# Patient Record
Sex: Female | Born: 1950 | Race: White | Hispanic: No | Marital: Married | Smoking: Never smoker
Health system: Southern US, Community
[De-identification: ages and names within clinical notes are randomized; demographics above are authoritative.]

## PROBLEM LIST (undated history)

## (undated) DIAGNOSIS — I10 Essential (primary) hypertension: Secondary | ICD-10-CM

## (undated) HISTORY — PX: REFRACTIVE SURGERY: SHX103

## (undated) HISTORY — DX: Essential (primary) hypertension: I10

---

## 1997-10-17 ENCOUNTER — Other Ambulatory Visit: Admission: RE | Admit: 1997-10-17 | Discharge: 1997-10-17 | Payer: Self-pay | Admitting: Obstetrics and Gynecology

## 2000-07-27 ENCOUNTER — Other Ambulatory Visit: Admission: RE | Admit: 2000-07-27 | Discharge: 2000-07-27 | Payer: Self-pay | Admitting: Obstetrics and Gynecology

## 2001-11-09 ENCOUNTER — Other Ambulatory Visit: Admission: RE | Admit: 2001-11-09 | Discharge: 2001-11-09 | Payer: Self-pay | Admitting: Obstetrics and Gynecology

## 2003-02-17 ENCOUNTER — Other Ambulatory Visit: Admission: RE | Admit: 2003-02-17 | Discharge: 2003-02-17 | Payer: Self-pay | Admitting: Obstetrics and Gynecology

## 2004-06-20 ENCOUNTER — Other Ambulatory Visit: Admission: RE | Admit: 2004-06-20 | Discharge: 2004-06-20 | Payer: Self-pay | Admitting: Obstetrics and Gynecology

## 2004-12-18 ENCOUNTER — Ambulatory Visit: Payer: Self-pay | Admitting: Family Medicine

## 2005-05-23 ENCOUNTER — Ambulatory Visit: Payer: Self-pay | Admitting: Family Medicine

## 2005-09-02 ENCOUNTER — Other Ambulatory Visit: Admission: RE | Admit: 2005-09-02 | Discharge: 2005-09-02 | Payer: Self-pay | Admitting: Obstetrics and Gynecology

## 2006-06-17 ENCOUNTER — Ambulatory Visit: Payer: Self-pay | Admitting: Family Medicine

## 2006-06-17 LAB — CONVERTED CEMR LAB
ALT: 20 units/L (ref 0–40)
AST: 22 units/L (ref 0–37)
Albumin: 4.1 g/dL (ref 3.5–5.2)
Alkaline Phosphatase: 72 units/L (ref 39–117)
BUN: 17 mg/dL (ref 6–23)
Basophils Absolute: 0 10*3/uL (ref 0.0–0.1)
CO2: 31 meq/L (ref 19–32)
Cholesterol: 201 mg/dL (ref 0–200)
Eosinophil percent: 0.8 % (ref 0.0–5.0)
GFR calc non Af Amer: 93 mL/min
Glomerular Filtration Rate, Af Am: 113 mL/min/{1.73_m2}
HCT: 43.3 % (ref 36.0–46.0)
Hgb A1c MFr Bld: 5.7 % (ref 4.6–6.0)
LDL DIRECT: 134.4 mg/dL
MCHC: 34.5 g/dL (ref 30.0–36.0)
Neutro Abs: 4.1 10*3/uL (ref 1.4–7.7)
Neutrophils Relative %: 61.9 % (ref 43.0–77.0)
RDW: 12.5 % (ref 11.5–14.6)
Sodium: 145 meq/L (ref 135–145)
TSH: 2.39 microintl units/mL (ref 0.35–5.50)
Total Protein: 7.2 g/dL (ref 6.0–8.3)
VLDL: 19 mg/dL (ref 0–40)

## 2006-06-23 ENCOUNTER — Ambulatory Visit: Payer: Self-pay | Admitting: Family Medicine

## 2006-07-21 HISTORY — PX: JOINT REPLACEMENT: SHX530

## 2006-10-27 ENCOUNTER — Encounter: Admission: RE | Admit: 2006-10-27 | Discharge: 2006-10-27 | Payer: Self-pay | Admitting: Family Medicine

## 2007-01-25 ENCOUNTER — Ambulatory Visit: Payer: Self-pay | Admitting: Family Medicine

## 2007-01-25 LAB — CONVERTED CEMR LAB
Direct LDL: 136.6 mg/dL
HDL: 44.6 mg/dL (ref >39.0)

## 2007-02-11 DIAGNOSIS — I10 Essential (primary) hypertension: Secondary | ICD-10-CM | POA: Insufficient documentation

## 2007-04-02 ENCOUNTER — Ambulatory Visit: Payer: Self-pay | Admitting: Family Medicine

## 2007-04-13 ENCOUNTER — Ambulatory Visit: Payer: Self-pay | Admitting: Family Medicine

## 2007-05-03 ENCOUNTER — Inpatient Hospital Stay (HOSPITAL_COMMUNITY): Admission: RE | Admit: 2007-05-03 | Discharge: 2007-05-05 | Payer: Self-pay | Admitting: Orthopedic Surgery

## 2007-06-07 ENCOUNTER — Inpatient Hospital Stay (HOSPITAL_COMMUNITY): Admission: RE | Admit: 2007-06-07 | Discharge: 2007-06-09 | Payer: Self-pay | Admitting: Orthopedic Surgery

## 2007-09-08 ENCOUNTER — Ambulatory Visit: Payer: Self-pay | Admitting: Family Medicine

## 2007-09-08 LAB — CONVERTED CEMR LAB
Albumin: 4.1 g/dL (ref 3.5–5.2)
Alkaline Phosphatase: 61 units/L (ref 39–117)
BUN: 12 mg/dL (ref 6–23)
Basophils Absolute: 0 10*3/uL (ref 0.0–0.1)
Bilirubin Urine: NEGATIVE
Blood in Urine, dipstick: NEGATIVE
Cholesterol: 168 mg/dL (ref 0–200)
Eosinophils Absolute: 0 10*3/uL (ref 0.0–0.6)
Eosinophils Relative: 0.3 % (ref 0.0–5.0)
GFR calc non Af Amer: 79 mL/min
Glucose, Bld: 97 mg/dL (ref 70–99)
Glucose, Urine, Semiquant: NEGATIVE
HDL: 56.1 mg/dL (ref >39.0)
Ketones, urine, test strip: NEGATIVE
Lymphocytes Relative: 23 % (ref 12.0–46.0)
Monocytes Relative: 6.3 % (ref 3.0–11.0)
RDW: 13.3 % (ref 11.5–14.6)
Sodium: 140 meq/L (ref 135–145)
Specific Gravity, Urine: 1.02
TSH: 1.34 microintl units/mL (ref 0.35–5.50)
Total CHOL/HDL Ratio: 3
Triglycerides: 78 mg/dL (ref 0–149)
WBC Urine, dipstick: NEGATIVE
pH: 7

## 2007-09-16 ENCOUNTER — Ambulatory Visit: Payer: Self-pay | Admitting: Family Medicine

## 2007-09-16 ENCOUNTER — Telehealth (INDEPENDENT_AMBULATORY_CARE_PROVIDER_SITE_OTHER): Payer: Self-pay | Admitting: *Deleted

## 2007-09-16 DIAGNOSIS — M1710 Unilateral primary osteoarthritis, unspecified knee: Secondary | ICD-10-CM | POA: Insufficient documentation

## 2007-09-16 DIAGNOSIS — E663 Overweight: Secondary | ICD-10-CM | POA: Insufficient documentation

## 2007-09-16 DIAGNOSIS — F330 Major depressive disorder, recurrent, mild: Secondary | ICD-10-CM

## 2007-09-18 ENCOUNTER — Encounter: Admission: RE | Admit: 2007-09-18 | Discharge: 2007-09-18 | Payer: Self-pay | Admitting: Orthopedic Surgery

## 2007-10-28 ENCOUNTER — Encounter: Admission: RE | Admit: 2007-10-28 | Discharge: 2007-10-28 | Payer: Self-pay | Admitting: Obstetrics and Gynecology

## 2007-11-09 ENCOUNTER — Encounter: Admission: RE | Admit: 2007-11-09 | Discharge: 2007-11-09 | Payer: Self-pay | Admitting: Obstetrics and Gynecology

## 2007-11-16 ENCOUNTER — Encounter: Payer: Self-pay | Admitting: Family Medicine

## 2008-04-03 ENCOUNTER — Encounter: Payer: Self-pay | Admitting: *Deleted

## 2008-04-25 ENCOUNTER — Encounter: Admission: RE | Admit: 2008-04-25 | Discharge: 2008-04-25 | Payer: Self-pay | Admitting: Family Medicine

## 2008-10-16 ENCOUNTER — Telehealth: Payer: Self-pay | Admitting: Family Medicine

## 2008-10-18 ENCOUNTER — Ambulatory Visit: Payer: Self-pay | Admitting: Family Medicine

## 2008-10-18 LAB — CONVERTED CEMR LAB
ALT: 20 units/L (ref 0–35)
AST: 20 units/L (ref 0–37)
Albumin: 3.9 g/dL (ref 3.5–5.2)
BUN: 13 mg/dL (ref 6–23)
Basophils Absolute: 0 10*3/uL (ref 0.0–0.1)
Basophils Relative: 0.4 % (ref 0.0–3.0)
Cholesterol: 181 mg/dL (ref 0–200)
Eosinophils Absolute: 0.1 10*3/uL (ref 0.0–0.7)
Eosinophils Relative: 1 % (ref 0.0–5.0)
HCT: 42.6 % (ref 36.0–46.0)
Ketones, urine, test strip: NEGATIVE
Lymphs Abs: 2 10*3/uL (ref 0.7–4.0)
MCHC: 34.9 g/dL (ref 30.0–36.0)
MCV: 87 fL (ref 78.0–100.0)
Monocytes Absolute: 0.4 10*3/uL (ref 0.1–1.0)
Monocytes Relative: 6 % (ref 3.0–12.0)
Neutro Abs: 4 10*3/uL (ref 1.4–7.7)
Neutrophils Relative %: 61.6 % (ref 43.0–77.0)
RBC: 4.89 M/uL (ref 3.87–5.11)
RDW: 12.6 % (ref 11.5–14.6)
Sodium: 141 meq/L (ref 135–145)
Specific Gravity, Urine: 1.025
Total Protein: 7.1 g/dL (ref 6.0–8.3)
Triglycerides: 88 mg/dL (ref 0.0–149.0)
Urobilinogen, UA: 0.2
WBC Urine, dipstick: NEGATIVE
WBC: 6.5 10*3/uL (ref 4.5–10.5)
pH: 6.5

## 2008-12-19 ENCOUNTER — Ambulatory Visit: Payer: Self-pay | Admitting: Family Medicine

## 2008-12-21 ENCOUNTER — Telehealth: Payer: Self-pay | Admitting: Family Medicine

## 2009-02-05 ENCOUNTER — Telehealth: Payer: Self-pay | Admitting: Family Medicine

## 2009-04-02 ENCOUNTER — Ambulatory Visit: Payer: Self-pay | Admitting: Internal Medicine

## 2009-04-09 ENCOUNTER — Encounter: Admission: RE | Admit: 2009-04-09 | Discharge: 2009-04-09 | Payer: Self-pay | Admitting: Obstetrics and Gynecology

## 2009-04-10 ENCOUNTER — Ambulatory Visit: Payer: Self-pay | Admitting: Internal Medicine

## 2010-01-10 ENCOUNTER — Ambulatory Visit: Payer: Self-pay | Admitting: Family Medicine

## 2010-01-10 LAB — CONVERTED CEMR LAB
AST: 18 units/L (ref 0–37)
Albumin: 4 g/dL (ref 3.5–5.2)
Alkaline Phosphatase: 83 units/L (ref 39–117)
BUN: 24 mg/dL — ABNORMAL HIGH (ref 6–23)
Basophils Relative: 0.4 % (ref 0.0–3.0)
Calcium: 9 mg/dL (ref 8.4–10.5)
Cholesterol: 175 mg/dL (ref 0–200)
Eosinophils Absolute: 0.1 10*3/uL (ref 0.0–0.7)
GFR calc non Af Amer: 107.6 mL/min (ref >60)
HDL: 40.1 mg/dL (ref >39.00)
Ketones, urine, test strip: NEGATIVE
Lymphocytes Relative: 30.1 % (ref 12.0–46.0)
Lymphs Abs: 2.4 10*3/uL (ref 0.7–4.0)
MCHC: 34.3 g/dL (ref 30.0–36.0)
Monocytes Absolute: 0.5 10*3/uL (ref 0.1–1.0)
Neutro Abs: 4.8 10*3/uL (ref 1.4–7.7)
RDW: 14.5 % (ref 11.5–14.6)
Sodium: 140 meq/L (ref 135–145)
Specific Gravity, Urine: >=1.03
Total CHOL/HDL Ratio: 4
WBC: 7.8 10*3/uL (ref 4.5–10.5)

## 2010-01-22 ENCOUNTER — Ambulatory Visit: Payer: Self-pay | Admitting: Family Medicine

## 2010-02-19 ENCOUNTER — Telehealth: Payer: Self-pay | Admitting: Family Medicine

## 2010-08-20 NOTE — Assessment & Plan Note (Signed)
Summary: cpx no pap//ccm/pt rsc from bmp/cjr   Vital Signs:  Patient profile:   60 year old female Menstrual status:  postmenopausal Height:      61.25 inches Weight:      205 pounds BMI:     38.56 Temp:     98.2 degrees F oral BP sitting:   120 / 80  (left arm) Cuff size:   regular  Vitals Entered By: Kern Reap CMA Duncan Dull) (January 22, 2010 2:48 PM) CC: cpx   CC:  cpx.  Allergies (verified): No Known Drug Allergies   Complete Medication List: 1)  Metoprolol Succinate 50 Mg Tb24 (Metoprolol succinate) .... Take 1 tablet by mouth every morning 2)  Hydrochlorothiazide 25 Mg Tabs (Hydrochlorothiazide) .... Take 1 tablet by mouth every morning 3)  Fish Oil Oil (Fish oil) .... One am and two qpm 4)  Caltrate 600+d 600-400 Mg-unit Tabs (Calcium carbonate-vitamin d) .... Two times a day 5)  Vitamin D 1000 Unit Tabs (Cholecalciferol) .... Once daily 6)  Zoloft 25 Mg Tabs (Sertraline hcl) .... Take one every morning  Other Orders: Prescription Created Electronically [phone]) EKG w/ Interpretation ([address])  Patient Instructions: 1)  Please schedule a follow-up appointment in 1 year. 2)  It is important that you exercise regularly at least 20 minutes 5 times a week. If you develop chest pain, have severe difficulty breathing, or feel very tired , stop exercising immediately and seek medical attention. 3)  Schedule your mammogram. 4)  Schedule a colonoscopy/sigmoidoscopy to help detect colon cancer. 5)  Take calcium +Vitamin D daily. 6)  Take an Aspirin every day. 7)  check your blood pressure daily for 3 weeks to be sure off the lisinopril, your blood pressure stays normal.  Call if any problems Prescriptions: ZOLOFT 25 MG TABS (SERTRALINE HCL) take one every morning  #100 x 3   Entered and Authorized by:   Roderick Pee MD   Signed by:   Roderick Pee MD on 01/22/2010   Method used:   [facility]        [facility] [address] [phone]* (retail)       [address]       [place], [place]  [address]       Ph: [phone] or [phone]       Fax: [phone]   RxID:   [id number] HYDROCHLOROTHIAZIDE 25 MG  TABS (HYDROCHLOROTHIAZIDE) Take 1 tablet by mouth every morning  #30.0 x 10   Entered and Authorized by:   Roderick Pee MD   Signed by:   Roderick Pee MD on 01/22/2010   Method used:   [facility]        [facility] [address] [phone]* (retail)       [address]       [place], [place]  [address]       Ph: [phone] or [phone]       Fax: [phone]   RxID:   [id number] METOPROLOL SUCCINATE 50 MG  TB24 (METOPROLOL SUCCINATE) Take 1 tablet by mouth every morning  #100 x 3   Entered and Authorized by:   Roderick Pee MD   Signed by:   Roderick Pee MD on 01/22/2010   Method used:   [facility]        [facility] [address] [phone]* (retail)  [address]       [place], [place]  [address]       Ph: [phone] or [phone]       Fax: [phone]   RxID:   [id number]     Appended Document: cpx no pap//ccm/pt rsc from bmp/cjr     History of Present Illness: Rebekah Fernandez is a 60 year old, married female, nonsmoker, who comes in today for physical evaluation for multiple issues.  She is a history of underlying hypertension, for which he takes metacarpal 50 mg daily and lisinopril 5 mg daily.  Last fall with switch to Cozaar because of a cough however, she never got the Cozaar filled.  Two days ago.  She stopped the lisinopril.  She, states she's going to [employer] and every time she loses weight and stays away from salt.  Her blood pressure typically stays normal with the beta-blocker alone.  Advised to check her BP daily to be sure.  She takes Zoloft 25 mg daily for depression, mood is good.  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  She also takes hydrochlorothiazide 25 mg daily for fluid  retention.  Her anxiety level has neck markedly diminished.  She does not need to take the operating room, nor the Ambien.  She gets routine eye care, dental care, BSE monthly, annual mammography due.....Marland Kitchen advised to call today and get set up for mammogram, colonoscopy, normal, and GI, tetanus, 06.  Advised annual flu shot  Allergies: No Known Drug Allergies  Past History:  Past medical, surgical, family and social histories (including risk factors) reviewed, and no changes noted (except as noted below).  Past Medical History: Reviewed history from 12/19/2008 and no changes required. Hypertension DJD-knees Obesity depression bilateral knee replacements  Past Surgical History: Reviewed history from 02/11/2007 and no changes required. CB x 3  Family History: Reviewed history from 02/11/2007 and no changes required. Family History Hypertension Family History Lung cancer Fam Hx M Fam Hx LymphomaI  Social History: Reviewed history from 04/02/2007 and no changes required. Never Smoked Alcohol use-yes-rare Married  Review of Systems      See HPI  Physical Exam  General:  Well-developed,well-nourished,in no acute distress; alert,appropriate and cooperative throughout examination Head:  Normocephalic and atraumatic without obvious abnormalities. No apparent alopecia or balding. Eyes:  No corneal or conjunctival inflammation noted. EOMI. Perrla. Funduscopic exam benign, without hemorrhages, exudates or papilledema. Vision grossly normal. Ears:  External ear exam shows no significant lesions or deformities.  Otoscopic examination reveals clear canals, tympanic membranes are intact bilaterally without bulging, retraction, inflammation or discharge. Hearing is grossly normal bilaterally. Nose:  External nasal examination shows no deformity or inflammation. Nasal mucosa are pink and moist without lesions or exudates. Mouth:  Oral mucosa and oropharynx without lesions or exudates.   Teeth in good repair. Neck:  No deformities, masses, or tenderness noted. Chest Wall:  No deformities, masses, or tenderness noted. Breasts:  No mass, nodules, thickening, tenderness, bulging, retraction, inflamation, nipple discharge or skin changes noted.   Lungs:  Normal respiratory effort, chest expands symmetrically. Lungs are clear to auscultation, no crackles or wheezes. Heart:  Normal rate and regular rhythm. S1 and S2 normal without gallop, murmur, click, rub or other extra sounds. Abdomen:  Bowel sounds positive,abdomen soft and non-tender without masses, organomegaly or hernias noted. Msk:  No deformity or scoliosis noted of thoracic or lumbar spine.   Pulses:  R and L carotid,radial,femoral,dorsalis pedis and posterior tibial pulses are full and equal bilaterally Extremities:  No clubbing, cyanosis, edema, or deformity noted with normal full range of motion of all joints.   Neurologic:  No cranial nerve deficits noted. Station and gait are normal. Plantar reflexes are down-going bilaterally. DTRs are symmetrical throughout. Sensory, motor and coordinative functions appear intact. Skin:  total body skin exam normal except for two scars in each knee from previous total knee replacements Cervical Nodes:  No lymphadenopathy noted Axillary Nodes:  No palpable lymphadenopathy Inguinal Nodes:  No significant adenopathy Psych:  Cognition and judgment appear intact. Alert and cooperative with normal attention span and concentration. 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) Assessment: Improved  Orders: Prescription Created Electronically [phone]) EKG w/ Interpretation (93000)  Problem # 3:  MAJOR DEPRESSIVE DISORDER RECURRENT  EPISODE MILD (ICD-296.31) Assessment: Improved  Orders: Prescription Created Electronically [phone]) EKG w/ Interpretation (93000)  Problem # 4:  HEALTH SCREENING (ICD-V70.0) Assessment: Comment Only  Orders: Prescription Created Electronically [phone]) EKG w/ Interpretation (93000)  Problem # 5:  HYPERTENSION (ICD-401.9) Assessment: Improved  Her updated medication list for this problem includes:    Metoprolol Succinate 50 Mg Tb24 (Metoprolol succinate) .Marland Kitchen... Take 1 tablet by mouth every morning    Hydrochlorothiazide 25 Mg Tabs (Hydrochlorothiazide) .Marland Kitchen... Take 1 tablet by mouth every morning  Orders: Prescription Created Electronically [phone]) EKG w/ Interpretation (93000)  Complete Medication List: 1)  Metoprolol Succinate 50 Mg Tb24 (Metoprolol succinate) .... Take 1 tablet by mouth every morning 2)  Hydrochlorothiazide 25 Mg Tabs (Hydrochlorothiazide) .... Take 1 tablet by mouth every morning 3)  Fish Oil Oil (Fish oil) .... One am and two qpm 4)  Caltrate 600+d 600-400 Mg-unit Tabs (Calcium carbonate-vitamin d) .... Two times a day 5)  Vitamin D 1000 Unit Tabs (Cholecalciferol) .... Once daily 6)  Zoloft 25 Mg Tabs (Sertraline hcl) .... Take one every morning  Patient Instructions: 1)  Please schedule a follow-up appointment in 1 year. 2)  It is important that you exercise regularly at least 20 minutes 5 times a week. If you develop chest pain, have severe difficulty breathing, or feel very tired , stop exercising immediately and seek medical attention. 3)  You need to lose weight. Consider a lower calorie diet and regular exercise.  4)  Schedule your mammogram. 5)  Schedule a colonoscopy/sigmoidoscopy to help detect colon cancer. 6)  Take calcium +Vitamin D daily. 7)  Take an Aspirin every day.

## 2010-08-20 NOTE — Progress Notes (Signed)
Summary: Pt req to be put back on Lisinopril 5mg   Phone Note Call from Patient Call back at [phone] cell Message from:  Patient on February 19, 2010 9:47 AM  Caller: Patient Summary of Call: Pt called and said that her bp spikes up about every 3 days. Pt is req to be put back on the Lisinipril 5mg . [facility]s [facility] in to [facility] [phone] Initial call taken by: Lucy Antigua,  February 19, 2010 9:49 AM  Follow-up for Phone Call        lisinopril 5 mg, dispense 100 tablets directions one q.a.m. for high blood pressure refill x 2 Follow-up by: Roderick Pee MD,  February 19, 2010 4:19 PM    New/Updated Medications: LISINOPRIL 5 MG TABS (LISINOPRIL) take one tab by mouth every morning Prescriptions: LISINOPRIL 5 MG TABS (LISINOPRIL) take one tab by mouth every morning  #100 x 2   Entered by:   Kern Reap CMA (AAMA)   Authorized by:   Roderick Pee MD   Signed by:   Kern Reap CMA (AAMA) on 02/19/2010   Method used:   [facility]        [facility] [address] [phone]* (retail)       [address]       [place], [place]  [address]       Ph: [phone] or [phone]       Fax: [phone]   RxID:   [id number]

## 2010-09-20 ENCOUNTER — Other Ambulatory Visit: Payer: Self-pay | Admitting: Family Medicine

## 2010-09-20 DIAGNOSIS — I10 Essential (primary) hypertension: Secondary | ICD-10-CM

## 2010-10-28 ENCOUNTER — Other Ambulatory Visit: Payer: Self-pay | Admitting: Family Medicine

## 2010-11-29 ENCOUNTER — Other Ambulatory Visit: Payer: Self-pay | Admitting: Family Medicine

## 2010-11-29 DIAGNOSIS — Z1231 Encounter for screening mammogram for malignant neoplasm of breast: Secondary | ICD-10-CM

## 2010-12-03 NOTE — H&P (Signed)
NAMECERENITY, GOSHORN                 ACCOUNT NO.:  [id number]   MEDICAL RECORD NO.:  [id number]          PATIENT TYPE:  INP   LOCATION:  NA                           FACILITY:  [facility]   PHYSICIAN:  Madlyn Frankel. Charlann Boxer, M.D.  DATE OF BIRTH:  November 14, 1950   DATE OF ADMISSION:  DATE OF DISCHARGE:                              HISTORY & PHYSICAL   PROCEDURE PERFORMED:  Right total knee arthroplasty.   CHIEF COMPLAINT:  Right knee pain.   HISTORY OF PRESENT ILLNESS:  The patient is a 60 year old female with a  history of right knee pain secondary to osteoarthritis.  This been  refractory to all conservative treatment including arthroscopic surgery  two times.  She has been presurgicallyt assessed by Dr. Kelle Darting.   PAST MEDICAL HISTORY:  The past medical history is significant for:  1. Osteoarthritis.  2. Hypertension.   PAST SURGICAL HISTORY:  History of arthroscopic surgery, two times, each  knee.   FAMILY HISTORY:  Heart disease, cancer, and arthritis.   SOCIAL HISTORY:  The patient ius married.  Primary caregiver will be  husband, Aariah Godette, after surgery.   DRUG ALLERGIES:  NO KNOWN DRUG ALLERGIES.   MEDICATIONS:  1. HCTZ 25 mg one p.o. daily.  2. Metoprolol 50 mg 1/2 tablet q.a.m. and 1/2 tablet q.p.m.  3. Lisinopril 5 mg 1/2 tablet q.a.m. and a 1/2 tab q.p.m.  4. Celebrex 200 mg p.o. b.i.d. times two days after surgery.   REVIEW OF SYSTEMS:  See HPI.   PHYSICAL EXAMINATION:  VITAL SIGNS:  Pulse 60, respirations 18, and  blood pressure 116/68.  GENERAL:  Awake, alert, and oriented, well-developed, well-nourished, in  no acute distress.  NECK:  Supple, no carotid bruits.  CHEST/LUNGS:  Clear to auscultation bilaterally.  BREASTS:  Deferred.  HEART:  Regular rate and rhythm without gallops, clicks, rubs, or  murmurs.  ABDOMEN:  The abdomen is soft, nontender, nondistended.  Bowel sounds  are present.  GENITOURINARY:  Deferred.  EXTREMITIES:  Her right knee does come out  to full extension and flexion  to 120 degrees.  SKIN:  Intact, no cellulitis.  NEUROLOGIC:  Intact distal sensibilities.   LABORATORY STUDIES:  The laboratory studies are pending.  EKG shows  normal sinus rhythm.  Chest x-ray is pending.   IMPRESSION:  1. Osteoarthritis.  2. Hypertension.   PLAN:  The plan of action is a right total knee arthroplasty by surgeon  Dr. Durene Romans.  The risks and complications were discussed.   Postoperative medications including Lovenox, Robaxin, iron, and aspirin,  Colace and MiraLax provided at time of history and physical.  Pain  medication will be provided at the time of surgery.     ______________________________  Yetta Glassman [address], [place]      Madlyn Frankel. Charlann Boxer, M.D.  Electronically Signed    BLM/MEDQ  D:  04/28/2007  T:  04/28/2007  Job:  [id number]   cc:   Sharlet Salina L. [address], [place]   Tinnie Gens A. Tawanna Cooler, MD  [address] [place]  [place] [address]

## 2010-12-03 NOTE — Op Note (Signed)
Rebekah Fernandez, Rebekah Fernandez                 ACCOUNT NO.:  [id number]   MEDICAL RECORD NO.:  [id number]          PATIENT TYPE:  INP   LOCATION:  0006                         FACILITY:  [facility]   PHYSICIAN:  Madlyn Frankel. Charlann Boxer, M.D.  DATE OF BIRTH:  [birth year]   DATE OF PROCEDURE:  05/03/2007  DATE OF DISCHARGE:                               OPERATIVE REPORT   PREOPERATIVE DIAGNOSIS:  Right knee osteoarthritis.   POSTOPERATIVE DIAGNOSIS:  Right knee osteoarthritis.   PROCEDURE:  Right total knee replacement.   COMPONENTS USED:  DePuy rotating platform posterior-stabilized knee with  a size 2-1/2 femur, size 2 tibia, 10-mm insert and a 35 patellar button.   SURGEON:  Madlyn Frankel. Charlann Boxer, M.D.   ASSISTANT:  Yetta Glassman. Mann, PA   ANESTHESIA:  Dermal, spinal.   DRAINS:  X1.   TOURNIQUET TIME:  53 minutes at 250 mmHg.   COMPLICATIONS:  None.   INDICATION FOR PROCEDURE:  Rebekah Fernandez is a 60 year old female who has  been managed in the office conservatively for several years now with  advanced bilateral knee osteoarthritis.  Radiographs reveal the left  knee is more severe the right but symptomatic her right knee continued  to bother her despite conservative attempts of injections, viscous  supplementations and medication.  She had tolerated it long enough and  felt that her quality of life and activity level was significantly  affected by her knees.  She was conscious all of her activities prior  due to her pain.  She wished to proceed with knee replacement surgery.  We discussed risks and benefits of infection, DVT, component failure,  need for revision surgery, need for an extensive therapy postop, and  concern for stiffness and the possible need for manipulation for the  benefit of pain relief.  Consent was obtained.   PROCEDURE IN DETAIL:  The patient was brought to the operative theater.  Once adequate anesthesia and preoperative antibiotics, Ancef,  administered, the patient was positioned  supine.  A proximal thigh  tourniquet was placed.  The right lower extremity was then prescrubbed,  then prepped and draped in a sterile fashion.  The leg was  exsanguinated, tourniquet elevated to 250 mmHg.  A midline incision was  made, followed by a modified median parapatellar arthrotomy.  Following  initial debridement, attention was first directed at the patella with  the initial prepatellar cut made about 21 mm.  I resected another 14 mm  and used a 35 patellar button.  A metal shim was placed to protect the  patella from retractors during the case.  Attention was now directed to  the femur.  The femoral canal was opened with a drill, then irrigated to  prevent fat emboli.  Intramedullary rod was then passed and at 5 degrees  of valgus, 10 mm of bone was cut off the distal femur.   I then sized the femur to be a size 2-1/2 based on the posterior  condylar axis, which matched that of the perpendicular to [employer].  The anterior-posterior and chamfer cuts were then all made.  There  was no notching.   Final box cut was made based off the lateral aspect of the distal femur.  Following this, attention was directed to the tibia.  The patient noted  to have a lot of arthritis on the medial side of the knee with a large  amount of osteophytes on the medial proximal tibia.  I cut 10 mm of bone  off the proximal tibia and then checked with an extension block.  Once I  was satisfied that the knee came to full extension with the 10 mm block  in place, the final preparation of the tibia was carried out.  Based on  the tibia size, I chose to use a size 2 and lateralized it to the  lateral border of the proximal tibia and then removed osteophytes  medially.  There was a good fit.  On checking with alignment rod, I was  happy that the cut was perpendicular in both planes.  I then drilled and  keel-punched, then carried out a reduction with a 2-1/2 femur, 2 tibia,  and a 10-mm insert.  The  knee came out to full extension and was very  stable from extension into flexion.  The patella tracked without  application of pressure.  Trial components were thus removed.  Further  debridement, including the posterior condyle on the medial side, was  carried out.  Once I was happy with all my debridements of any further  remaining bone, the knee was irrigated with normal saline solution,  injected in the synovial layer with 60 mL of 0.25% Marcaine with  epinephrine and 1 mL of Toradol.  Cement was mixed and the final  components were cemented into position.  The knee was brought to  extension with a 10-mm insert while extruded cement was removed.  Once  the cement had cured, I removed excessive cement from the posterior  aspect of the knee.  Once I was satisfied that I could not visualize any  more cement, I irrigated the knee out again and injected 5 mL of FloSeal  in the posterior, medial and lateral aspect of the knee and then placed  the final 10-mm insert.  At this point the medium Hemovac drain was  placed and the knee was brought to flexion.  The tourniquet was let down  at 53 minutes.  The extensor mechanism was reapproximated using a #1  Vicryl with the knee in flexion.  The remainder of the wound was closed  in layers with 2-0 Vicryl and a running 4-0 Monocryl.  The knee was  cleaned, dried and dressed sterilely with Steri-Strips and a bulky  sterile wrap.  She was brought to the recovery room in stable condition.   Following closure of the extensor mechanism, I did demonstrate passively  a range of motion of 0-130 degrees with the knee dropping down nice, 120  degrees at least.  Her knee ligaments were stable.      Madlyn Frankel Charlann Boxer, M.D.  Electronically Signed     MDO/MEDQ  D:  05/03/2007  T:  05/04/2007  Job:  [id number]

## 2010-12-03 NOTE — Op Note (Signed)
Rebekah Fernandez, Rebekah Fernandez                 ACCOUNT NO.:  [id number]   MEDICAL RECORD NO.:  [id number]          PATIENT TYPE:  INP   LOCATION:  1612                         FACILITY:  [facility]   PHYSICIAN:  Madlyn Frankel. Charlann Boxer, M.D.  DATE OF BIRTH:  Dec 14, 1950   DATE OF PROCEDURE:  06/07/2007  DATE OF DISCHARGE:                               OPERATIVE REPORT   PREOPERATIVE DIAGNOSIS:  Left knee osteoarthritis.   POSTOPERATIVE DIAGNOSIS:  Left knee osteoarthritis.   PROCEDURE:  Left total knee replacement.   COMPONENTS USED:  DePuy rotating platform posterior stabilized knee  system:  Size 2.5 femur, 2 tibia, 10 mm insert and a 35 patellar button.   SURGEON:  Madlyn Frankel. Charlann Boxer, M.D.   ASSISTANT:  Yetta Glassman. Mann, PA   ANESTHESIA:  Dermal with spinal.   BLOOD LOSS:  Minimal.   DRAINS:  One.   TOURNIQUET TIME:  40 minutes at 250 mmHg.   COMPLICATIONS:  None.   INDICATIONS OF THE PROCEDURE:  Rebekah Fernandez is a 60 year old female with  history of advanced bilateral knee osteoarthritis, status post a right  total knee replacement -- that she had done very well with.  She had  advanced bilateral knee arthritis and wished to proceed with the  procedure in the stated fashion.  The risks and benefits were reviewed in the follow-up visit.  H&P was  performed and the consent obtained at that time.   PROCEDURE IN DETAIL:  The patient was brought to the operative theater.  Once adequate anesthesia, preoperative antibiotics, Ancef were  administered, the patient was positioned supine with the left thigh  tourniquet placed.  I did briefly look at the right knee.  She lacked  just a few degrees of full extension, but flexed to 120 degrees.  There  was no manipulation performed.   The left lower extremity was then prepped and draped in sterile fashion.  Midline incision was made, followed by a median parapatellar arthrotomy  with patella subluxation.  Following initial debridement, attention was  directed to the patella.  Precut measurement was 23 mm.  I resected down  to 14 mm.  The cut surface following debridement of the osteophytes  measured grade to a 35, which matched the contralateral knee.  I placed  the button and checked my post cut measurement, which was about 24 mm.  I then plate kept the button in place to protect the cut surface of the  patella from retractors.  At this point, attention was directed to the  femur.  The femoral canal was opened and irrigated to prevent fat  emboli.  The intramedullary rod was then passed and at 5 degrees valgus  I resected 10 mm of bone, which is what I did on the contralateral knee.  I then measured the distal femur and found it was a 2.5, like the other  knee.  The anterior and posterior chamfer cuts were all then made, based  off the posterior condylar axis -- which was perpendicular to [employer].   At this point, I prepared the final box cut, based  off the lateral  aspect of the distal femur.  Attention was now directed to the tibial;  with tibial subluxation further debridement was carried out as  necessary.  Based off the unilateral side, I resected 10 mm of bone --  which amounted to a skin cut through the very most medial portion of the  tibia; but did result in a cut of 1-2 mm at the lowest point.   At this point, I checked with the extension block, and the knee came out  to full extension with a 10 mm extension block in place.  There were no  significant osteophytes posteriorly.  I did elevate the MCL off the  proximal tibia and was able to remove osteophytes.  This will further  decompress the MCL.  I did use an osteotome to debride osteophytes off  of the medial and lateral distal femur.   At this point, we checked with a size 2 tibial tray, which fit like the  contralateral knee.  I pinned this in position and checked with  alignment rod; was very happy with the cut in both planes.  I drilled  and keel punched  the tibia.  Trial reduction was carried out with a 2.5  femur, 2 tibia, and a 10 mm insert.  The knee came to extension.  The  patella tracked without application of pressure. The ligaments were  stable.   At this point, all trial components were then removed.  Further  debridements was carried out as necessary.  The knee was irrigated with  normal saline solution with pulse lavage, and the capsular at the  synovial layer injected with 0.25% Marcaine with epinephrine and 1 mL of  Toradol.  Final components were opened and cement mixed.  Final  components were then cemented into position, and the knee brought out to  extension with a 10 mL insert.  Extruded cement was removed.  Once the  cement had cured, the knee was irrigated and remaining cement removed.  Once we were satisfied there was no visualized cement anywhere medial,  lateral or posterior that we could see, the final 10-mm insert was  placed.  Then 7 mL of FloSeal was injected in the posterior, medial and  lateral gutters and capsule.  There was no significant hemostasis  required.  At this point the knee was brought to flexion.  The medium  Hemovac drain was placed.  The extensor mechanism was then  reapproximated using a #1 Vicryl with the knee in flexion.  The  remaining wound was closed in layers with 2-0 Vicryl and 4-0 running  Monocryl.  The knee was cleaned, dried and dressed sterilely with Steri-  Strips and a sterile bulky dressing.  She was brought to the recovery  room in stable condition.  She tolerated the procedure well.      Madlyn Frankel Charlann Boxer, M.D.  Electronically Signed     MDO/MEDQ  D:  06/07/2007  T:  06/07/2007  Job:  [id number]

## 2010-12-06 NOTE — H&P (Signed)
NAMESELA, Rebekah Fernandez                 ACCOUNT NO.:  [id number]   MEDICAL RECORD NO.:  [id number]          PATIENT TYPE:  INP   LOCATION:                               FACILITY:  [facility]   PHYSICIAN:  Rebekah Fernandez, M.D.  DATE OF BIRTH:  1950-10-24   DATE OF ADMISSION:  06/07/2007  DATE OF DISCHARGE:                              HISTORY & PHYSICAL   PROCEDURE:  Left total knee arthroplasty.   CHIEF COMPLAINT:  Left knee pain.   HISTORY OF PRESENT ILLNESS:  A 60 year old female with a history of left  knee pain secondary to osteoarthritis with a recent history of a right  total knee replacement on May 03, 2007.  She has been progressing  well with this right total knee.  Her left knee has continued to bother  her, as she has a significant varus deformity of this left knee as well.  She has been refractory to all conservative treatment including oral  antiinflammatories, cortisone injection, and viscosupplementation.  She  has been presurgically assessed by her primary care physician, Dr.  Kelle Fernandez.   PAST MEDICAL HISTORY:  Significant for:  1. Osteoarthritis.  2. Hypertension.   PAST SURGICAL HISTORY:  Includes a right total knee arthroplasty with  size 2-1/2 femur, size 2 tibia, 10-mm insert, and a 35 patellar button.  Also multiple arthroscopic surgeries to knees.   FAMILY HISTORY:  Coronary artery disease, cancer, arthritis.   SOCIAL HISTORY:  Married to Rebekah Fernandez who will be primary caregiver after  surgery as well as sister Rebekah Fernandez.   DRUG ALLERGIES:  No known drug allergies.   MEDICATIONS:  Include:  1. Hydrochlorothiazide 25 mg p.o. daily.  2. Metoprolol 50 mg 1/2 tab q. a.m. and 1/2 tab q. p.m.  3. Lisinopril 5 mg 1/2 tab q. a.m. and 1/2 tab q. p.m.  4. Celebrex 200 mg p.o. b.i.d. x2 days after surgery.   REVIEW OF SYSTEMS:  MUSCULOSKELETAL:  Right total knee replacement  May 03, 2007.  See HPI.   PHYSICAL EXAMINATION:  VITAL SIGNS:  Pulse 72, respirations 18,  blood  pressure 116/78.  GENERAL:  Awake, alert, and oriented.  NECK:  Supple.  No carotid bruits.  CHEST:  Lungs clear to auscultation bilaterally.  HEART:  Regular rate and rhythm.  S1, S2 distinct.  ABDOMEN:  Soft, nontender.  Bowel sounds present.  EXTREMITIES:  Her right knee comes out zero to 105.  Left is varus  deformity when it comes out to full extension.  SKIN:  Left knee:  No signs of cellulitis.  Right knee shows a well-  healed incision.  NEUROLOGIC:  She has intact distal sensibilities of bilateral lower  extremities.   Labs are pending presurgical assessment.   EKG previously showed normal sinus rhythm.   Chest x-ray was negative.  No acute cardiopulmonary disease.   IMPRESSION:  1. Osteoarthritis.  2. Hypertension.   PLAN OF ACTION:  Left total knee arthroplasty on June 07, 2007,  [facility], surgeon Dr. Durene Fernandez.  Risks and complications  were discussed.  Questions were encouraged, answered,  and reviewed.   Postoperative medications were not given.  Pain medicines will be  prescribed at time of surgery.     ______________________________  Rebekah Fernandez Rebekah Fernandez, [place]      Rebekah Fernandez, M.D.  Electronically Signed    BLM/MEDQ  D:  05/17/2007  T:  05/18/2007  Job:  [id number]   cc:   Tinnie Gens A. Tawanna Cooler, MD  [address] [place]  [place] [address]

## 2010-12-29 ENCOUNTER — Other Ambulatory Visit: Payer: Self-pay | Admitting: Family Medicine

## 2011-01-06 ENCOUNTER — Ambulatory Visit: Payer: Self-pay

## 2011-01-20 ENCOUNTER — Ambulatory Visit
Admission: RE | Admit: 2011-01-20 | Discharge: 2011-01-20 | Disposition: A | Discharge status: Home / Self Care | Payer: BC Managed Care – PPO | Source: Ambulatory Visit | Attending: Family Medicine | Admitting: Family Medicine

## 2011-01-20 DIAGNOSIS — Z1231 Encounter for screening mammogram for malignant neoplasm of breast: Secondary | ICD-10-CM

## 2011-01-30 ENCOUNTER — Other Ambulatory Visit: Payer: Self-pay

## 2011-02-06 ENCOUNTER — Encounter: Payer: Self-pay | Admitting: Family Medicine

## 2011-03-11 ENCOUNTER — Other Ambulatory Visit: Payer: Self-pay

## 2011-03-17 ENCOUNTER — Telehealth: Payer: Self-pay | Admitting: Family Medicine

## 2011-03-17 NOTE — Telephone Encounter (Signed)
patient  Coming in for a physical next month.  Is this okay to fill?

## 2011-03-17 NOTE — Telephone Encounter (Signed)
Refill Alprazolam. Last refill was 2009. Does not take regularly, only prn. [facility] call [facility] [address]

## 2011-03-17 NOTE — Telephone Encounter (Signed)
ok 

## 2011-03-18 ENCOUNTER — Other Ambulatory Visit (INDEPENDENT_AMBULATORY_CARE_PROVIDER_SITE_OTHER): Payer: BC Managed Care – PPO

## 2011-03-18 DIAGNOSIS — Z0000 Encounter for general adult medical examination without abnormal findings: Secondary | ICD-10-CM

## 2011-03-18 LAB — CBC WITH DIFFERENTIAL/PLATELET
Basophils Relative: 0.6 % (ref 0.0–3.0)
Eosinophils Relative: 0.6 % (ref 0.0–5.0)
Lymphocytes Relative: 31.4 % (ref 12.0–46.0)
Lymphs Abs: 1.8 10*3/uL (ref 0.7–4.0)
MCHC: 33.3 g/dL (ref 30.0–36.0)
MCV: 88.4 fl (ref 78.0–100.0)
Neutro Abs: 3.4 10*3/uL (ref 1.4–7.7)
RBC: 4.84 Mil/uL (ref 3.87–5.11)
RDW: 14 % (ref 11.5–14.6)

## 2011-03-18 LAB — BASIC METABOLIC PANEL
BUN: 17 mg/dL (ref 6–23)
CO2: 28 mEq/L (ref 19–32)
Calcium: 9 mg/dL (ref 8.4–10.5)
Creatinine, Ser: 0.6 mg/dL (ref 0.4–1.2)
Glucose, Bld: 107 mg/dL — ABNORMAL HIGH (ref 70–99)

## 2011-03-18 LAB — POCT URINALYSIS DIPSTICK
Nitrite, UA: NEGATIVE
Spec Grav, UA: 1.02
pH, UA: 6.5

## 2011-03-18 LAB — LIPID PANEL
Cholesterol: 163 mg/dL (ref 0–200)
Total CHOL/HDL Ratio: 3

## 2011-03-18 LAB — HEPATIC FUNCTION PANEL
Alkaline Phosphatase: 65 U/L (ref 39–117)
Bilirubin, Direct: 0 mg/dL (ref 0.0–0.3)
Total Bilirubin: 0.5 mg/dL (ref 0.3–1.2)

## 2011-03-18 MED ORDER — ALPRAZOLAM 0.5 MG PO TABS: 0.5000 mg | ORAL_TABLET | Freq: Every evening | ORAL | Status: AC | PRN

## 2011-03-18 NOTE — Telephone Encounter (Signed)
rx called in

## 2011-04-08 ENCOUNTER — Ambulatory Visit: Payer: BC Managed Care – PPO | Admitting: Family Medicine

## 2011-04-08 DIAGNOSIS — Z0000 Encounter for general adult medical examination without abnormal findings: Secondary | ICD-10-CM

## 2011-04-08 DIAGNOSIS — I10 Essential (primary) hypertension: Secondary | ICD-10-CM

## 2011-04-08 DIAGNOSIS — E663 Overweight: Secondary | ICD-10-CM

## 2011-04-08 DIAGNOSIS — F329 Major depressive disorder, single episode, unspecified: Secondary | ICD-10-CM

## 2011-04-29 LAB — CBC
Hemoglobin: 10 — ABNORMAL LOW
Hemoglobin: 9.9 — ABNORMAL LOW
Hemoglobin: 13.9
Platelets: 201
RBC: 3.21 — ABNORMAL LOW
RBC: 3.25 — ABNORMAL LOW
RBC: 4.6
RDW: 13.4
RDW: 13.1
WBC: 7.8

## 2011-04-29 LAB — BASIC METABOLIC PANEL
BUN: 6
BUN: 9
CO2: 26
Calcium: 8.3 — ABNORMAL LOW
GFR calc Af Amer: >60
GFR calc non Af Amer: >60
GFR calc non Af Amer: >60
Glucose, Bld: 136 — ABNORMAL HIGH
Potassium: 3.7
Sodium: 138

## 2011-04-29 LAB — COMPREHENSIVE METABOLIC PANEL
ALT: 16
AST: 17
Alkaline Phosphatase: 59
GFR calc Af Amer: >60
Glucose, Bld: 125 — ABNORMAL HIGH
Potassium: 4.2
Sodium: 136
Total Protein: 7.1

## 2011-04-29 LAB — TYPE AND SCREEN: Antibody Screen: POSITIVE

## 2011-04-29 LAB — URINE MICROSCOPIC-ADD ON

## 2011-04-29 LAB — URINALYSIS, ROUTINE W REFLEX MICROSCOPIC
Glucose, UA: NEGATIVE
Ketones, ur: NEGATIVE
Protein, ur: NEGATIVE

## 2011-04-29 LAB — PROTIME-INR: Prothrombin Time: 13.2

## 2011-04-30 LAB — BASIC METABOLIC PANEL
CO2: 26
Chloride: 106
GFR calc Af Amer: >60
Glucose, Bld: 108 — ABNORMAL HIGH
Potassium: 3.8
Sodium: 137

## 2011-04-30 LAB — CBC
HCT: 30.5 — ABNORMAL LOW
Hemoglobin: 10.7 — ABNORMAL LOW
MCHC: 35
MCV: 87.4
RBC: 3.49 — ABNORMAL LOW
RDW: 13.8

## 2011-05-01 LAB — URINALYSIS, ROUTINE W REFLEX MICROSCOPIC
Glucose, UA: NEGATIVE
Ketones, ur: NEGATIVE
Nitrite: NEGATIVE
Protein, ur: NEGATIVE
pH: 7

## 2011-05-01 LAB — BASIC METABOLIC PANEL
BUN: 11
CO2: 27
Chloride: 108
GFR calc non Af Amer: >60
Glucose, Bld: 156 — ABNORMAL HIGH
Potassium: 3.7
Sodium: 137

## 2011-05-01 LAB — CBC
HCT: 31.3 — ABNORMAL LOW
Hemoglobin: 11.1 — ABNORMAL LOW
MCHC: 34.7
MCHC: 35.4
MCV: 86.5
MCV: 87.3
Platelets: 236
RDW: 13.3
RDW: 14.1 — ABNORMAL HIGH

## 2011-05-01 LAB — TYPE AND SCREEN: ABO/RH(D): A NEG

## 2011-05-01 LAB — COMPREHENSIVE METABOLIC PANEL
ALT: 19
AST: 18
CO2: 29
Calcium: 9.5
Creatinine, Ser: 0.68
GFR calc Af Amer: >60
GFR calc non Af Amer: >60
Sodium: 140
Total Protein: 6.7

## 2011-05-01 LAB — PROTIME-INR: Prothrombin Time: 12.4

## 2011-05-01 LAB — APTT: aPTT: 28

## 2011-05-23 NOTE — Progress Notes (Signed)
System Downtime Recovery The EMR experienced a system downtime.  This downtime occurred on 04-08-2011. During this downtime paper charting was completed by the provider.  The visit was documented on paper during the downtime and will be scanned into [facility]/Epic, billing was completed by the [facility] Billing Department .  The visit is being closed on behalf of the provider. 

## 2011-06-16 ENCOUNTER — Other Ambulatory Visit: Payer: Self-pay | Admitting: Family Medicine

## 2011-09-05 ENCOUNTER — Other Ambulatory Visit: Payer: Self-pay | Admitting: Family Medicine

## 2011-12-25 ENCOUNTER — Other Ambulatory Visit: Payer: Self-pay | Admitting: Family Medicine

## 2012-01-23 ENCOUNTER — Telehealth: Payer: Self-pay | Admitting: Family Medicine

## 2012-01-23 ENCOUNTER — Encounter: Payer: Self-pay | Admitting: Internal Medicine

## 2012-01-23 ENCOUNTER — Ambulatory Visit (INDEPENDENT_AMBULATORY_CARE_PROVIDER_SITE_OTHER): Payer: BC Managed Care – PPO | Admitting: Internal Medicine

## 2012-01-23 VITALS — BP 140/80 | HR 60 | Temp 97.9°F | Wt 206.0 lb

## 2012-01-23 DIAGNOSIS — M1710 Unilateral primary osteoarthritis, unspecified knee: Secondary | ICD-10-CM

## 2012-01-23 DIAGNOSIS — E663 Overweight: Secondary | ICD-10-CM

## 2012-01-23 DIAGNOSIS — M62830 Muscle spasm of back: Secondary | ICD-10-CM

## 2012-01-23 DIAGNOSIS — M5380 Other specified dorsopathies, site unspecified: Secondary | ICD-10-CM

## 2012-01-23 DIAGNOSIS — I10 Essential (primary) hypertension: Secondary | ICD-10-CM

## 2012-01-23 DIAGNOSIS — M549 Dorsalgia, unspecified: Secondary | ICD-10-CM

## 2012-01-23 MED ORDER — CYCLOBENZAPRINE HCL 10 MG PO TABS: 10.0000 mg | ORAL_TABLET | Freq: Three times a day (TID) | ORAL | Status: DC | PRN

## 2012-01-23 NOTE — Progress Notes (Signed)
  Subjective:    Patient ID: Rebekah Fernandez, female    DOB: [birth year], 61 y.o.   MRN: [id number]  HPI Patient comes in today for SDA for  new problem evaluation. PCP is not in office today. Onset  Of acute LBP after sitting  20 minutes  on GYNE table July 3. Then worse the next day . persistent   Feels better to bend over and hyperextension very painful with spasm. No radiation to legs or weakness.  Took 2 aleve that usually helps but didn't this time.  Tried left over 1/2 Hydrocodone with some help but has had problems taking this in the past and prefers not to use this as a solution.   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 Usually   Review of Systems NO fever cp sob knee fu Dr Charlann Boxer next week has bil TKR.  No change in bowel bladder  uti sx.  Ht stable  Mood stable taking sertraline 25 3 x per week  Doesn't want to relapse as is doing well.  Past history family history social history reviewed in the electronic medical record.     Objective:   Physical Exam BP 140/80  Pulse 60  Temp 97.9 F (36.6 C) (Oral)  Wt 206 lb (93.441 kg)  SpO2 96% Wt Readings from Last 3 Encounters:  01/23/12 206 lb (93.441 kg)  01/22/10 205 lb (92.987 kg)  12/19/08 203 lb (92.08 kg)   WDWN in mild mod distress  Prefers to lean over table   Chest:  Clear to A&P without wheezes rales or rhonchi CV:  S1-S2 no gallops or murmurs peripheral perfusion is normal BACK low sacral area pain with spasm   Le no acute findings toe heel walk nl  Neg slr.  ? Some lordosis  No scoliosis  Pain with extension  Not much with flexion Knees  Well healed scars no acute findings.  Neuro appears non focal  bent over gait .    Assessment & Plan:   Back pain acute worse with hyperextension. Strain ? Hx of mild issue in the past.   consider further eval if   persistent or progressive poss pt etc.   Avoiding narcotic at this time and i agree. Add mild muscle relaxant and disc with dr Charlann Boxer and fu with Dr Tawanna Cooler . Back hygiene and weight loss will help. Obesity  HT  Total visit > 50% spent counseling and coordinating care

## 2012-01-23 NOTE — Telephone Encounter (Signed)
Caller: Brisa/Patient; PCP: Roderick Pee.; CB#: [phone]; Call regarding Back Pain;  Low back pain began 01/22/12.  Relates back pain to prolonged wait for GYN with legs hangs over edge of table 01/21/12 and work at home including going up and down ladder 01/22/12.   Used  left over Hdrocodone X 2 in past 48 hrs. Also taking Aleve.  Reports difficulty walking and standing up straight. LMP/menopausal.  May use ice pack X 20 min.  Advised to seee MD now for new onset of severe disabling back pain per Back Symptoms. Appt scheduled for 01/23/12 at 1500 with Dr Fabian Sharp.

## 2012-01-23 NOTE — Patient Instructions (Signed)
Avoiding prolong sitting.  And laid flat or walk as tolerated. No forced activity  Can use a muscle relaxant as needed can take a half dose.  Continued 2 Aleve twice a day for now.  Plan contact Dr. Nilsa Nutting office and see if he can evaluate your back also.  Some  times physical therapy is also helpful.  May take a few weeks to be back to baseline. 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  FU with Dr Tawanna Cooler or ortho in 2-4 weeks   Or as needed. Lumbosacral Strain Lumbosacral strain is one of the most common causes of back pain. There are many causes of back pain. Most are not serious conditions. CAUSES  Your backbone (spinal column) is made up of 24 main vertebral bodies, the sacrum, and the coccyx. These are held together by muscles and tough, fibrous tissue (ligaments). Nerve roots pass through the openings between the vertebrae. A sudden move or injury to the back may cause injury to, or pressure on, these nerves. This may result in localized back pain or pain movement (radiation) into the buttocks, down the leg, and into the foot. Sharp, shooting pain from the buttock down the back of the leg (sciatica) is frequently associated with a ruptured (herniated) disk. Pain may be caused by muscle spasm alone. Your caregiver can often find the cause of your pain by the details of your symptoms and an exam. In some cases, you may need tests (such as X-rays). Your caregiver will work with you to decide if any tests are needed based on your specific exam. HOME CARE INSTRUCTIONS   Avoid an underactive lifestyle. Active exercise, as directed by your caregiver, is your greatest weapon against back pain.   Avoid hard physical activities (tennis, racquetball, waterskiing) if you are not in proper physical condition for it. This may aggravate or create problems.   If you have a back problem, avoid sports requiring sudden body movements. Swimming and walking are generally safer  activities.   Maintain good posture.   Avoid becoming overweight (obese).   Use bed rest for only the most extreme, sudden (acute) episode. Your caregiver will help you determine how much bed rest is necessary.   For acute conditions, you may put ice on the injured area.   Put ice in a plastic bag.   Place a towel between your skin and the bag.   Leave the ice on for 15 to 20 minutes at a time, every 2 hours, or as needed.   After you are improved and more active, it may help to apply heat for 30 minutes before activities.  See your caregiver if you are having pain that lasts longer than expected. Your caregiver can advise appropriate exercises or therapy if needed. With conditioning, most back problems can be avoided. SEEK IMMEDIATE MEDICAL CARE IF:   You have numbness, tingling, weakness, or problems with the use of your arms or legs.   You experience severe back pain not relieved with medicines.   There is a change in bowel or bladder control.   You have increasing pain in any area of the body, including your belly (abdomen).   You notice shortness of breath, dizziness, or feel faint.   You feel sick to your stomach (nauseous), are throwing up (vomiting), or become sweaty.   You notice discoloration of your toes or legs, or your feet get very cold.   Your back pain is getting worse.  You have a fever.  MAKE SURE YOU:   Understand these instructions.   Will watch your condition.   Will get help right away if you are not doing well or get worse.  Document Released: 04/16/2005 Document Revised: 06/26/2011 Document Reviewed: 10/06/2008 [facility] Patient Information 2012 [place], [place].

## 2012-01-24 DIAGNOSIS — M549 Dorsalgia, unspecified: Secondary | ICD-10-CM | POA: Insufficient documentation

## 2012-01-28 NOTE — Telephone Encounter (Signed)
Pt called and came in to see Padonda on 01/23/12 re: back pain. Pt was given Flexirill but its not helpling. Pt said that Padonda told pt that she could call in a pain med, if needed. Pt is req pain med to [facility].

## 2012-03-25 ENCOUNTER — Other Ambulatory Visit: Payer: Self-pay | Admitting: Family Medicine

## 2012-03-31 ENCOUNTER — Other Ambulatory Visit (INDEPENDENT_AMBULATORY_CARE_PROVIDER_SITE_OTHER): Payer: BC Managed Care – PPO

## 2012-03-31 DIAGNOSIS — Z0000 Encounter for general adult medical examination without abnormal findings: Secondary | ICD-10-CM

## 2012-03-31 LAB — HEPATIC FUNCTION PANEL
ALT: 17 U/L (ref 0–35)
Alkaline Phosphatase: 70 U/L (ref 39–117)
Bilirubin, Direct: 0 mg/dL (ref 0.0–0.3)
Total Bilirubin: 0.4 mg/dL (ref 0.3–1.2)

## 2012-03-31 LAB — BASIC METABOLIC PANEL
BUN: 20 mg/dL (ref 6–23)
Calcium: 8.8 mg/dL (ref 8.4–10.5)
Chloride: 105 mEq/L (ref 96–112)
Creatinine, Ser: 0.6 mg/dL (ref 0.4–1.2)

## 2012-03-31 LAB — CBC WITH DIFFERENTIAL/PLATELET
Eosinophils Absolute: 0.1 10*3/uL (ref 0.0–0.7)
Eosinophils Relative: 0.9 % (ref 0.0–5.0)
Lymphocytes Relative: 31.1 % (ref 12.0–46.0)
MCV: 86.9 fl (ref 78.0–100.0)
Monocytes Absolute: 0.4 10*3/uL (ref 0.1–1.0)
Neutrophils Relative %: 62.1 % (ref 43.0–77.0)
Platelets: 231 10*3/uL (ref 150.0–400.0)
WBC: 6.6 10*3/uL (ref 4.5–10.5)

## 2012-03-31 LAB — POCT URINALYSIS DIPSTICK
Protein, UA: NEGATIVE
Spec Grav, UA: 1.025
Urobilinogen, UA: 0.2

## 2012-03-31 LAB — LIPID PANEL
Cholesterol: 162 mg/dL (ref 0–200)
LDL Cholesterol: 98 mg/dL (ref 0–99)
Triglycerides: 89 mg/dL (ref 0.0–149.0)
VLDL: 17.8 mg/dL (ref 0.0–40.0)

## 2012-04-08 ENCOUNTER — Encounter: Payer: Self-pay | Admitting: Family Medicine

## 2012-04-08 ENCOUNTER — Ambulatory Visit (INDEPENDENT_AMBULATORY_CARE_PROVIDER_SITE_OTHER): Payer: BC Managed Care – PPO | Admitting: Family Medicine

## 2012-04-08 VITALS — BP 110/80 | Temp 97.7°F | Ht 61.75 in | Wt 203.0 lb

## 2012-04-08 DIAGNOSIS — F330 Major depressive disorder, recurrent, mild: Secondary | ICD-10-CM

## 2012-04-08 DIAGNOSIS — IMO0002 Reserved for concepts with insufficient information to code with codable children: Secondary | ICD-10-CM

## 2012-04-08 DIAGNOSIS — E663 Overweight: Secondary | ICD-10-CM

## 2012-04-08 DIAGNOSIS — R319 Hematuria, unspecified: Secondary | ICD-10-CM

## 2012-04-08 DIAGNOSIS — I10 Essential (primary) hypertension: Secondary | ICD-10-CM

## 2012-04-08 DIAGNOSIS — M1710 Unilateral primary osteoarthritis, unspecified knee: Secondary | ICD-10-CM

## 2012-04-08 LAB — POCT URINALYSIS DIPSTICK
Glucose, UA: NEGATIVE
Ketones, UA: NEGATIVE
Leukocytes, UA: NEGATIVE
Protein, UA: NEGATIVE
Urobilinogen, UA: 0.2

## 2012-04-08 MED ORDER — METOPROLOL SUCCINATE ER 50 MG PO TB24: 50.0000 mg | ORAL_TABLET | Freq: Every day | ORAL | Status: DC

## 2012-04-08 MED ORDER — LOSARTAN POTASSIUM-HCTZ 50-12.5 MG PO TABS: ORAL_TABLET | ORAL | Status: DC

## 2012-04-08 MED ORDER — LORAZEPAM 0.5 MG PO TABS: ORAL_TABLET | ORAL | Status: DC

## 2012-04-08 MED ORDER — SERTRALINE HCL 25 MG PO TABS: 25.0000 mg | ORAL_TABLET | Freq: Every day | ORAL | Status: DC

## 2012-04-08 NOTE — Patient Instructions (Signed)
30 minute walk daily  Do a thorough breast exam monthly  Return in one year sooner if any problems  We will combine the Cozaar and the hydrochlorothiazide and to a tablet called Hyzaar,,,,,,,,,,,,,, one half tablet daily

## 2012-04-08 NOTE — Addendum Note (Signed)
Addended by: Kern Reap B on: 04/08/2012 05:01 PM   Modules accepted: Orders

## 2012-04-08 NOTE — Progress Notes (Signed)
  Subjective:    Patient ID: Rebekah Fernandez, female    DOB: 12-10-1950, 61 y.o.   MRN: [id number]  Rebekah Fernandez is a 61 year old married female nonsmoker who comes in today for general physical examination  She continues to struggle with her weight she is 203 pounds 61.75 inches tall  She takes Cozaar 25 mg, Toprol 50 mg and hydrochlorothiazide 25 mg for hypertension BP 110/80.  She takes Zoloft 25 mg 3 times weekly for mild anxiety  She gets routine eye care, dental care, and you mammography colonoscopy x2 she does not do a breast exam monthly. She has light skin and light eyes and meticulous about wearing her sunscreens  Tetanus booster 2006 seasonal flu shot at work  She would also like to take something for flying. 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   HENT: Negative.   Eyes: Negative.   Respiratory: Negative.   Cardiovascular: Negative.   Gastrointestinal: Negative.   Genitourinary: Negative.   Musculoskeletal: Negative.   Neurological: Negative.   Hematological: Negative.   Psychiatric/Behavioral: Negative.        Objective:   Physical Exam  Constitutional: She appears well-developed and well-nourished.  HENT:  Head: Normocephalic and atraumatic.  Right Ear: External ear normal.  Left Ear: External ear normal.  Nose: Nose normal.  Mouth/Throat: Oropharynx is clear and moist.  Eyes: EOM are normal. Pupils are equal, round, and reactive to light.  Neck: Normal range of motion. Neck supple. No thyromegaly present.  Cardiovascular: Normal rate, regular rhythm, normal heart sounds and intact distal pulses.  Exam reveals no gallop and no friction rub.   No murmur heard. Pulmonary/Chest: Effort normal and breath sounds normal.  Abdominal:  Soft. Bowel sounds are normal. She exhibits no distension and no mass. There is no tenderness. There is no rebound.  Genitourinary: Vagina normal and uterus normal. Guaiac negative stool. No vaginal discharge found.  Musculoskeletal: Normal range of motion.  Lymphadenopathy:    She has no cervical adenopathy.  Neurological: She is alert. She has normal reflexes. No cranial nerve deficit. She exhibits normal muscle tone. Coordination normal.  Skin: Skin is warm and dry.       Total body skin exam normal except for scars right left knee from previous knee replacements  Psychiatric: She has a normal mood and affect. Her behavior is normal. Judgment and thought content normal.          Assessment & Plan:  Healthy female  Overweight continue to encourage diet exercise and weight loss  Hypertension continue current medication  Mild anxiety continue to taper Zoloft  Status post bilateral total knee replacements  Asymptomatic hematuria,,,,,,,,,, her gynecologist has noted off-and-on for many years she occasionally would have tracer small amount of blood near. Repeat here shows trace therefore will observe

## 2012-07-02 ENCOUNTER — Other Ambulatory Visit: Payer: Self-pay | Admitting: Family Medicine

## 2012-07-09 ENCOUNTER — Other Ambulatory Visit: Payer: Self-pay | Admitting: Family Medicine

## 2012-10-13 ENCOUNTER — Ambulatory Visit (INDEPENDENT_AMBULATORY_CARE_PROVIDER_SITE_OTHER): Payer: BC Managed Care – PPO | Admitting: Internal Medicine

## 2012-10-13 ENCOUNTER — Encounter: Payer: Self-pay | Admitting: Internal Medicine

## 2012-10-13 VITALS — BP 138/80 | HR 65 | Temp 98.1°F | Wt 207.0 lb

## 2012-10-13 DIAGNOSIS — R29898 Other symptoms and signs involving the musculoskeletal system: Secondary | ICD-10-CM

## 2012-10-13 DIAGNOSIS — M546 Pain in thoracic spine: Secondary | ICD-10-CM

## 2012-10-13 DIAGNOSIS — M549 Dorsalgia, unspecified: Secondary | ICD-10-CM

## 2012-10-13 MED ORDER — CYCLOBENZAPRINE HCL 10 MG PO TABS: ORAL_TABLET | ORAL | Status: DC

## 2012-10-13 MED ORDER — HYDROCODONE-ACETAMINOPHEN 5-325 MG PO TABS: 1.0000 | ORAL_TABLET | ORAL | Status: DC | PRN

## 2012-10-13 NOTE — Patient Instructions (Addendum)
Maximum  2 aleve twice a day.  Pain med for now and  To get in to neurosurgery asap.   Pain pill if needed.  Muscle relaxant if needed

## 2012-10-13 NOTE — Progress Notes (Signed)
Chief Complaint  Patient presents with  . Back Pain    Started on Saturday.  Was on a rough boat ride in [place].  Pain is in her upper back.  Took a Flexeril left over from previous prescription.  Has tingling and numbness in her arm  . Numbness    HPI: Patient comes in today for SDA for  new problem evaluation. pcp NA seen by me 7 13 fo a cute back pain. Onset   Plain on Saturday 3 days  and tool 3 aleve  Back is painful  and now arm feels heavy .  Boat ride and white caps  And bouncing on but.  Pain  8/10/  Took one flexeril   Left over  And had a few hours .    Not sleeping from this.  Has previous appt   Next week for her back   Hard to bend over and clean bathtub.  But this is stable  Arm feel heavy. This am and hard to do hair .  ? Numbness tingling . ? If left arm always not as strong as right  But arm heaviness is new  Feeling   ROS: See pertinent positives and negatives per HPI. No cp sob  .  Still get low back pain but no weakness   Or numbnes  No past medical history on file.  No family history on file.  History   Social History  . Marital Status: Married    Spouse Name: N/A    Number of Children: N/A  . Years of Education: N/A   Social History Main Topics  . Smoking status: Never Smoker   . Smokeless tobacco: Not on file  . Alcohol Use: Yes  . Drug Use: No  . Sexually Active: Not on file   Other Topics Concern  . Not on file   Social History Narrative  . No narrative on file    Outpatient Encounter Prescriptions as of 10/13/2012  Medication Sig Dispense Refill  . hydrochlorothiazide (HYDRODIURIL) 25 MG tablet TAKE 1 TABLET EVERY MORNING  100 tablet  4  . losartan-hydrochlorothiazide (HYZAAR) 50-12.5 MG per tablet One half tab every morning  90 tablet  3  . metoprolol succinate (TOPROL-XL) 50 MG 24 hr tablet Take 1 tablet (50 mg total) by mouth daily. Take with or immediately following a meal.  100 tablet  3  . sertraline (ZOLOFT) 25 MG tablet Take 25 mg by  mouth daily. Takes on Monday, Wednesday, and Friday.      . [DISCONTINUED] sertraline (ZOLOFT) 25 MG tablet Take 1 tablet (25 mg total) by mouth daily.  90 tablet  3  . HYDROcodone-acetaminophen (NORCO/VICODIN) 5-325 MG per tablet Take 1 tablet by mouth every 4 (four) hours as needed for pain.  24 tablet  0  . LORazepam (ATIVAN) 0.5 MG tablet 1/2-1 tablet 1 hour prior to airplane trip  20 tablet  1   No facility-administered encounter medications on file as of 10/13/2012.    EXAM:  BP 138/80  Pulse 65  Temp(Src) 98.1 F (36.7 C) (Oral)  Wt 207 lb (93.895 kg)  BMI 38.19 kg/m2  SpO2 98%  Body mass index is 38.19 kg/(m^2).  GENERAL: vitals reviewed and listed above, alert, oriented, appears well hydrated and in no acute distress  NECK: no obvious masses on inspection palpation  No bony tenderness  . can shrug .  Tender left upper trap neck area  LUNGS:  Normal resp effort t CV: HRRR, no  clubbing cyanosis or  peripheral edema nl cap refill  MS: moves all extremities     Neuro dtrs noted    Notable decrease  Muscle strength in   Left arm extension not from pain   Grip seems almost equal.   As well as wrist   Able to lift purse  To waist   No tremor  dtrs   Present bic trice[s?? Left  No clonus    PSYCH: pleasant and cooperative, no obvious depression or anxiety Gait ok   ASSESSMENT AND PLAN:  Discussed the following assessment and plan:  Weakness of left arm - left arm proximal extensor ?new onset  with upper back pain.  - Plan: Ambulatory referral to Neurosurgery  Acute upper back pain - Plan: Ambulatory referral to Neurosurgery Consider steroids empiric and  Imaging;   but would be best if directed by NS.   Refer  For earlier appt.  Friday  March 28  To seek immedidate care if increasing weakness  Pain med  Flexeril if needed -Patient advised to return or notify health care team  if symptoms worsen or persist or new concerns arise.  Patient Instructions  Maximum  2 aleve twice  a day.  Pain med for now and  To get in to neurosurgery asap.   Pain pill if needed.  Muscle relaxant if needed   Neta Mends. Jacksyn Beeks M.D.

## 2012-10-14 ENCOUNTER — Telehealth: Payer: Self-pay | Admitting: Internal Medicine

## 2012-10-14 NOTE — Telephone Encounter (Signed)
Script was at pharmacy after all/kh

## 2012-12-11 ENCOUNTER — Other Ambulatory Visit: Payer: Self-pay | Admitting: Family Medicine

## 2013-02-23 ENCOUNTER — Other Ambulatory Visit: Payer: Self-pay

## 2013-02-23 DIAGNOSIS — Z1231 Encounter for screening mammogram for malignant neoplasm of breast: Secondary | ICD-10-CM

## 2013-03-11 ENCOUNTER — Ambulatory Visit
Admission: RE | Admit: 2013-03-11 | Discharge: 2013-03-11 | Disposition: A | Discharge status: Home / Self Care | Payer: BC Managed Care – PPO | Source: Ambulatory Visit

## 2013-03-11 DIAGNOSIS — Z1231 Encounter for screening mammogram for malignant neoplasm of breast: Secondary | ICD-10-CM

## 2013-04-09 ENCOUNTER — Other Ambulatory Visit: Payer: Self-pay | Admitting: Family Medicine

## 2013-04-12 ENCOUNTER — Other Ambulatory Visit: Payer: Self-pay | Admitting: Family Medicine

## 2013-04-18 ENCOUNTER — Other Ambulatory Visit: Payer: Self-pay | Admitting: *Deleted

## 2013-04-18 MED ORDER — HYDROCHLOROTHIAZIDE 25 MG PO TABS: ORAL_TABLET | ORAL | Status: DC

## 2013-05-16 ENCOUNTER — Encounter: Payer: Self-pay | Admitting: Family Medicine

## 2013-05-16 ENCOUNTER — Ambulatory Visit (INDEPENDENT_AMBULATORY_CARE_PROVIDER_SITE_OTHER): Payer: BC Managed Care – PPO | Admitting: Family Medicine

## 2013-05-16 VITALS — BP 120/80 | Temp 98.2°F | Wt 200.0 lb

## 2013-05-16 DIAGNOSIS — E663 Overweight: Secondary | ICD-10-CM

## 2013-05-16 DIAGNOSIS — I10 Essential (primary) hypertension: Secondary | ICD-10-CM

## 2013-05-16 DIAGNOSIS — F330 Major depressive disorder, recurrent, mild: Secondary | ICD-10-CM

## 2013-05-16 MED ORDER — LOSARTAN POTASSIUM-HCTZ 50-12.5 MG PO TABS: ORAL_TABLET | ORAL | Status: DC

## 2013-05-16 MED ORDER — METOPROLOL SUCCINATE ER 50 MG PO TB24: ORAL_TABLET | ORAL | Status: DC

## 2013-05-16 MED ORDER — LORAZEPAM 0.5 MG PO TABS: ORAL_TABLET | ORAL | Status: DC

## 2013-05-16 MED ORDER — SERTRALINE HCL 25 MG PO TABS: ORAL_TABLET | ORAL | Status: DC

## 2013-05-16 NOTE — Progress Notes (Signed)
  Subjective:    Patient ID: Rebekah Fernandez, female    DOB: [birth year], 62 y.o.   MRN: [id number]  HPI  Rebekah Fernandez is a 62 year old married female nonsmoker who comes in today to discuss anxiety and depression  She's had multiple episodes throughout the years. She had tapered herself down to 25 mg of Zoloft Monday Wednesday Friday. Most recently with the stress at work and the fact that a friend of their 60-year-old her anxiety and stress symptoms have increased. 3 weeks ago she started the Zoloft 25 mg daily and is beginning to feel somewhat better.  She also needs a refill on her blood pressure medication  Review of Systems    review of systems otherwise negative Objective:   Physical Exam Well-developed well-nourished female no acute distress vital signs stable she is afebrile she is oriented x3 appropriate       Assessment & Plan:  History of depression increase Zoloft to 50 mg in 2 weeks if the 25 mg doses that seem to be working. Ativan 0.5 twice a day when necessary in the meantime  Refill blood pressure medication

## 2013-05-16 NOTE — Patient Instructions (Signed)
Continue the Zoloft 25 mg  In 2 weeks if you still do not feel well double the dose and call us  Ativan 0.5........ one tablet twice daily as needed  I refilled all your blood pressure medication

## 2013-07-11 ENCOUNTER — Other Ambulatory Visit: Payer: Self-pay | Admitting: Family Medicine

## 2013-08-09 ENCOUNTER — Other Ambulatory Visit: Payer: BC Managed Care – PPO

## 2013-08-16 ENCOUNTER — Encounter: Payer: BC Managed Care – PPO | Admitting: Family Medicine

## 2013-09-10 ENCOUNTER — Telehealth: Payer: Self-pay | Admitting: Family Medicine

## 2013-09-10 DIAGNOSIS — I10 Essential (primary) hypertension: Secondary | ICD-10-CM

## 2013-09-10 DIAGNOSIS — F330 Major depressive disorder, recurrent, mild: Secondary | ICD-10-CM

## 2013-09-10 NOTE — Telephone Encounter (Signed)
[facility] requesting scripts for the following:  losartan-hydrochlorothiazide (HYZAAR) 50-12.5 MG per tablet metoprolol succinate (TOPROL-XL) 50 MG 24 hr tablet sertraline (ZOLOFT) 25 MG tablet

## 2013-09-12 MED ORDER — LOSARTAN POTASSIUM-HCTZ 50-12.5 MG PO TABS: ORAL_TABLET | ORAL | Status: DC

## 2013-09-12 MED ORDER — METOPROLOL SUCCINATE ER 50 MG PO TB24: ORAL_TABLET | ORAL | Status: DC

## 2013-09-12 MED ORDER — SERTRALINE HCL 25 MG PO TABS: ORAL_TABLET | ORAL | Status: DC

## 2013-10-04 ENCOUNTER — Other Ambulatory Visit (INDEPENDENT_AMBULATORY_CARE_PROVIDER_SITE_OTHER): Payer: BC Managed Care – PPO

## 2013-10-04 DIAGNOSIS — Z0000 Encounter for general adult medical examination without abnormal findings: Secondary | ICD-10-CM

## 2013-10-04 LAB — CBC WITH DIFFERENTIAL/PLATELET
BASOS PCT: 0.5 % (ref 0.0–3.0)
Basophils Absolute: 0 10*3/uL (ref 0.0–0.1)
EOS ABS: 0.1 10*3/uL (ref 0.0–0.7)
Eosinophils Relative: 1.3 % (ref 0.0–5.0)
HCT: 41.5 % (ref 36.0–46.0)
Hemoglobin: 14.1 g/dL (ref 12.0–15.0)
Lymphocytes Relative: 31.2 % (ref 12.0–46.0)
Lymphs Abs: 2.1 10*3/uL (ref 0.7–4.0)
MCHC: 34.1 g/dL (ref 30.0–36.0)
MCV: 86.4 fl (ref 78.0–100.0)
MONO ABS: 0.4 10*3/uL (ref 0.1–1.0)
Monocytes Relative: 5.8 % (ref 3.0–12.0)
NEUTROS PCT: 61.2 % (ref 43.0–77.0)
Neutro Abs: 4.1 10*3/uL (ref 1.4–7.7)
Platelets: 261 10*3/uL (ref 150.0–400.0)
RBC: 4.8 Mil/uL (ref 3.87–5.11)
RDW: 14 % (ref 11.5–14.6)
WBC: 6.7 10*3/uL (ref 4.5–10.5)

## 2013-10-04 LAB — LIPID PANEL
CHOLESTEROL: 165 mg/dL (ref 0–200)
HDL: 44.5 mg/dL (ref >39.00)
LDL Cholesterol: 108 mg/dL — ABNORMAL HIGH (ref 0–99)
Total CHOL/HDL Ratio: 4
Triglycerides: 61 mg/dL (ref 0.0–149.0)
VLDL: 12.2 mg/dL (ref 0.0–40.0)

## 2013-10-04 LAB — BASIC METABOLIC PANEL
BUN: 17 mg/dL (ref 6–23)
CHLORIDE: 106 meq/L (ref 96–112)
CO2: 26 meq/L (ref 19–32)
CREATININE: 0.7 mg/dL (ref 0.4–1.2)
Calcium: 8.9 mg/dL (ref 8.4–10.5)
GFR: 89.15 mL/min (ref >60.00)
Glucose, Bld: 107 mg/dL — ABNORMAL HIGH (ref 70–99)
Potassium: 3.7 mEq/L (ref 3.5–5.1)
Sodium: 139 mEq/L (ref 135–145)

## 2013-10-04 LAB — POCT URINALYSIS DIPSTICK
Bilirubin, UA: NEGATIVE
Glucose, UA: NEGATIVE
KETONES UA: NEGATIVE
LEUKOCYTES UA: NEGATIVE
NITRITE UA: NEGATIVE
PROTEIN UA: NEGATIVE
Spec Grav, UA: 1.025
UROBILINOGEN UA: 0.2
pH, UA: 5.5

## 2013-10-04 LAB — HEPATIC FUNCTION PANEL
ALBUMIN: 4 g/dL (ref 3.5–5.2)
ALT: 17 U/L (ref 0–35)
AST: 16 U/L (ref 0–37)
Alkaline Phosphatase: 64 U/L (ref 39–117)
BILIRUBIN DIRECT: 0 mg/dL (ref 0.0–0.3)
TOTAL PROTEIN: 7 g/dL (ref 6.0–8.3)
Total Bilirubin: 0.5 mg/dL (ref 0.3–1.2)

## 2013-10-04 LAB — TSH: TSH: 2.92 u[IU]/mL (ref 0.35–5.50)

## 2013-10-11 ENCOUNTER — Encounter: Payer: BC Managed Care – PPO | Admitting: Family Medicine

## 2013-11-21 ENCOUNTER — Other Ambulatory Visit: Payer: Self-pay | Admitting: Family Medicine

## 2014-01-04 ENCOUNTER — Encounter: Payer: Self-pay | Admitting: Family Medicine

## 2014-01-04 ENCOUNTER — Ambulatory Visit (INDEPENDENT_AMBULATORY_CARE_PROVIDER_SITE_OTHER): Payer: BC Managed Care – PPO | Admitting: Family Medicine

## 2014-01-04 VITALS — BP 110/80 | Temp 98.5°F | Ht 61.25 in | Wt 207.0 lb

## 2014-01-04 DIAGNOSIS — E663 Overweight: Secondary | ICD-10-CM

## 2014-01-04 DIAGNOSIS — I10 Essential (primary) hypertension: Secondary | ICD-10-CM

## 2014-01-04 DIAGNOSIS — F330 Major depressive disorder, recurrent, mild: Secondary | ICD-10-CM

## 2014-01-04 DIAGNOSIS — R3129 Other microscopic hematuria: Secondary | ICD-10-CM | POA: Insufficient documentation

## 2014-01-04 DIAGNOSIS — Z91013 Allergy to seafood: Secondary | ICD-10-CM | POA: Insufficient documentation

## 2014-01-04 MED ORDER — EPINEPHRINE 0.3 MG/0.3ML IJ SOAJ: INTRAMUSCULAR | Status: DC

## 2014-01-04 MED ORDER — METOPROLOL SUCCINATE ER 50 MG PO TB24: ORAL_TABLET | ORAL | Status: DC

## 2014-01-04 MED ORDER — SERTRALINE HCL 25 MG PO TABS: ORAL_TABLET | ORAL | Status: DC

## 2014-01-04 MED ORDER — LOSARTAN POTASSIUM-HCTZ 50-12.5 MG PO TABS: ORAL_TABLET | ORAL | Status: DC

## 2014-01-04 NOTE — Progress Notes (Signed)
   Subjective:    Patient ID: Rebekah Fernandez, female    DOB: 1950-11-14, 63 y.o.   MRN: [id number]  HPI Rey is a 63 year old married female nonsmoker who comes in today for general checkup because of a history of hypertension, mild depression, obesity, status post both knee replacements, and a new problem of shrimp allergy  Her meds reviewed there've been no changes. Because she's taken the Zoloft 25 mg daily she no longer needs the Ativan.  She gets routine eye care, dental care, does not do BSE monthly, does get annual mammography, she's had 2 colonoscopies because her mother had colon cancer both of which she reports were normal.  LMP age 58 she sees Dr. Gertie Fey for Pap smears. She's never had any difficulty. Discussed with him doing this every 3 years  New Year's Day she ate some shrimp and had some itching of her year couple months later she had some more shrimp and her tongue swelled up. She's had no previous history of shrimp or other shell food allergy.  Her weight is still 207 pounds although she says she started a diet and exercise program has lost 5 pounds in the past 2 weeks  When we had finished our discussion she stated that she forgot to tell me that she had allergic reaction to crab also. Therefore would recommend she have no shellfish and keep an EpiPen with her worker she goes   Review of Systems  Constitutional: Negative.   HENT: Negative.   Eyes: Negative.   Respiratory: Negative.   Cardiovascular: Negative.   Gastrointestinal: Negative.   Genitourinary: Negative.   Musculoskeletal: Negative.   Neurological: Negative.   Psychiatric/Behavioral: Negative.        Objective:   Physical Exam  Constitutional: She appears well-developed and well-nourished.  HENT:  Head: Normocephalic and atraumatic.  Right Ear: External ear normal.  Left Ear: External ear normal.  Nose: Nose normal.  Mouth/Throat: Oropharynx is clear and moist.  Eyes: EOM are normal. Pupils are  equal, round, and reactive to light.  Neck: Normal range of motion. Neck supple. No thyromegaly present.  Cardiovascular: Normal rate, regular rhythm, normal heart sounds and intact distal pulses.  Exam reveals no gallop and no friction rub.   No murmur heard. Pulmonary/Chest: Effort normal and breath sounds normal.  Abdominal: Soft. Bowel sounds are normal. She exhibits no distension and no mass. There is no tenderness. There is no rebound.  Genitourinary:  Bilateral breast exam normal  Musculoskeletal: Normal range of motion.  Lymphadenopathy:    She has no cervical adenopathy.  Neurological: She is alert. She has normal reflexes. No cranial nerve deficit. She exhibits normal muscle tone. Coordination normal.  Skin: Skin is warm and dry.  Total body skin exam normal except for 2 scars one on each anterior knee from previous total knee replacement.  Psychiatric: She has a normal mood and affect. Her behavior is normal. Judgment and thought content normal.          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 advised to avoid all shellfish and keep an EpiPen with her wherever she goes

## 2014-01-04 NOTE — Patient Instructions (Signed)
Continue your current medications  Because of your history of reaction to shrimp I would avoid shrimp in the future. I will also carry an EpiPen with you where you go just in case  The other option is to go see Dr. Rocky Morel to see if any specific testing can be done for other shellfish allergies  Continue diet and exercise and weight loss program  Followup in 1 year sooner if any problems  Remember to get her mammogram yearly, BSE monthly, and colonoscopy as outlined by Dr. Maurene Capes because of your family history of colon cancer

## 2014-01-04 NOTE — Progress Notes (Signed)
Pre visit review using our clinic review tool, if applicable. No additional management support is needed unless otherwise documented below in the visit note. 

## 2014-01-05 ENCOUNTER — Telehealth: Payer: Self-pay | Admitting: Family Medicine

## 2014-01-05 NOTE — Telephone Encounter (Signed)
Relevant patient education assigned to patient using Emmi. ° °

## 2014-01-09 ENCOUNTER — Telehealth: Payer: Self-pay | Admitting: Family Medicine

## 2014-01-09 DIAGNOSIS — J3489 Other specified disorders of nose and nasal sinuses: Secondary | ICD-10-CM

## 2014-01-09 NOTE — Telephone Encounter (Signed)
?   Sinus infection, fever 101, since Wednesday symptoms started as a sore throat then progressed.  [facility].

## 2014-01-09 NOTE — Telephone Encounter (Signed)
Left detailed message on machine for patient to go to the [place] office for sinus x-ray per Dr Sherren Mocha

## 2014-02-06 ENCOUNTER — Encounter: Payer: Self-pay | Admitting: Internal Medicine

## 2014-02-16 ENCOUNTER — Other Ambulatory Visit: Payer: Self-pay | Admitting: Family Medicine

## 2014-03-16 ENCOUNTER — Other Ambulatory Visit: Payer: Self-pay | Admitting: Family Medicine

## 2014-08-20 ENCOUNTER — Other Ambulatory Visit: Payer: Self-pay | Admitting: Family Medicine

## 2014-09-21 IMAGING — MG MM DIGITAL SCREENING BILAT
4 series · 4 of 4 positions shown · non-contrast
Comparison: Previous exam(s).

CLINICAL DATA: Screening.

DIGITAL SCREENING BILATERAL MAMMOGRAM WITH CAD

[R CC]
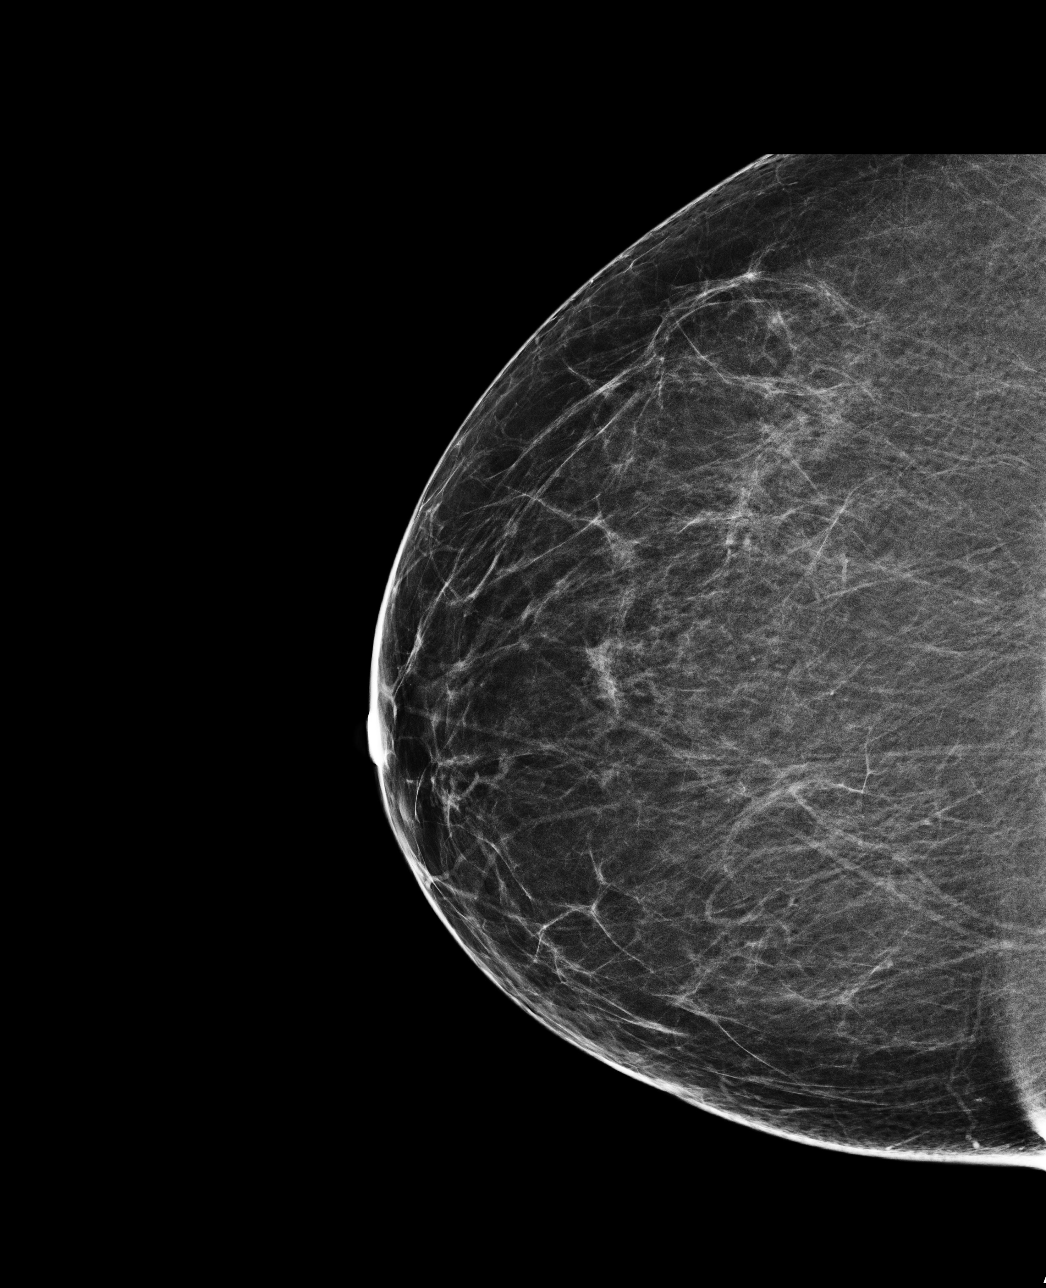

[L CC]
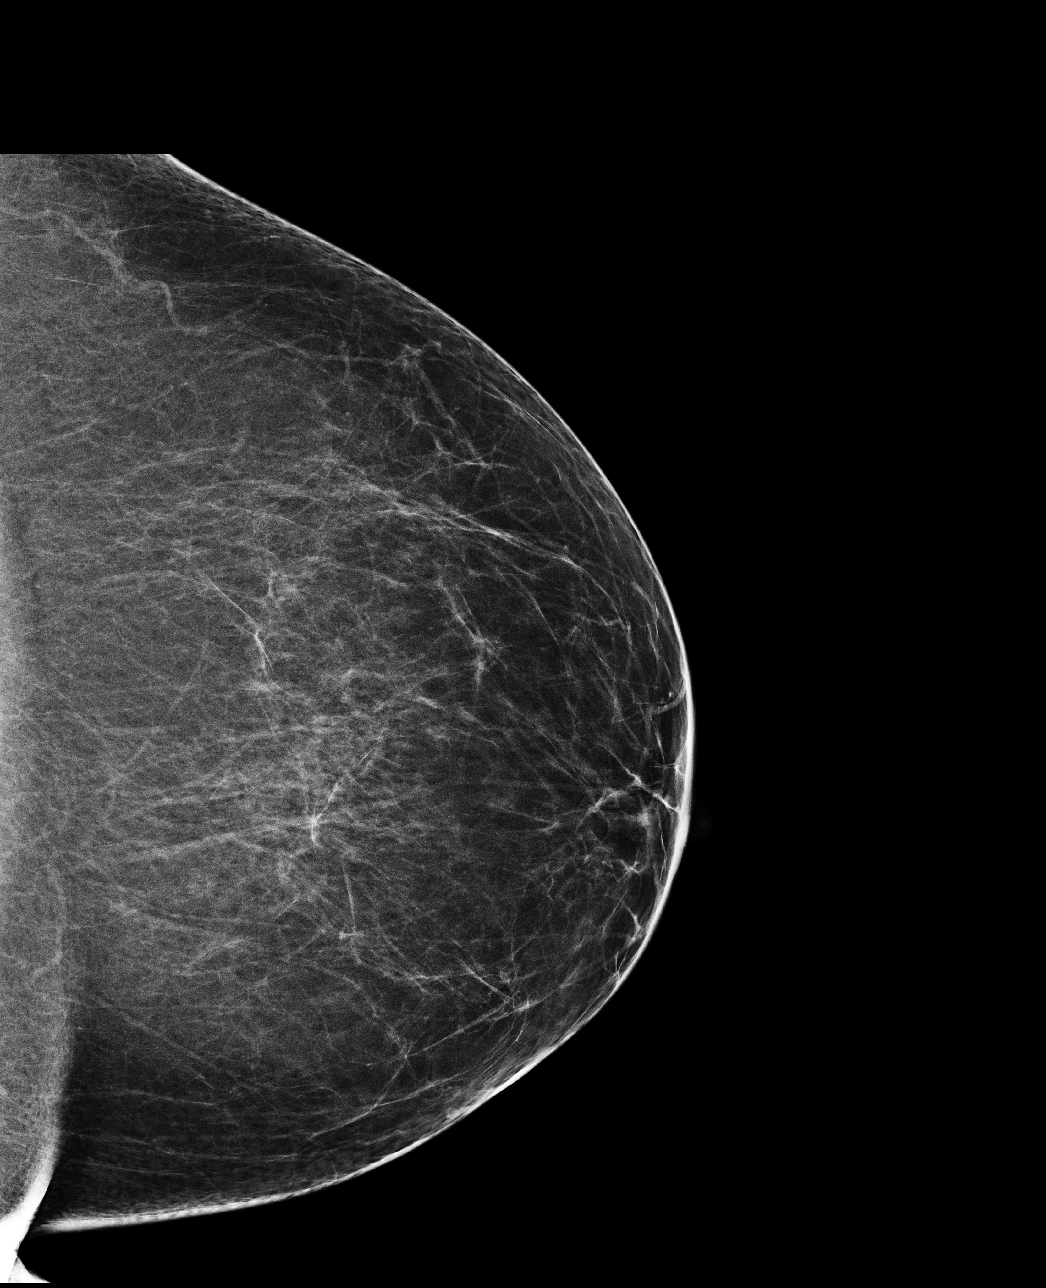

[R MLO]
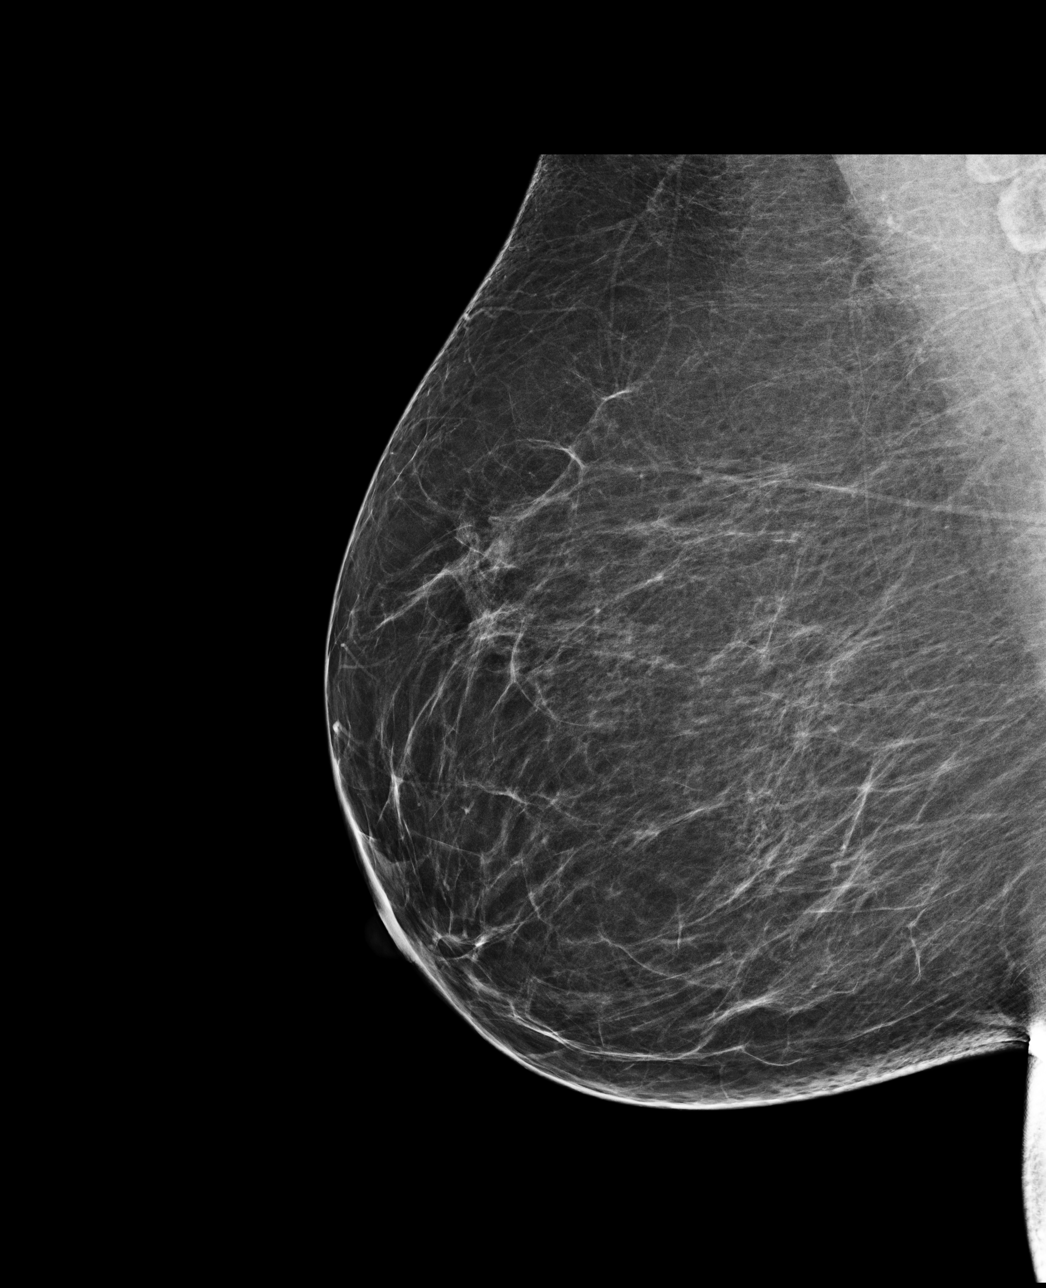

[L MLO]
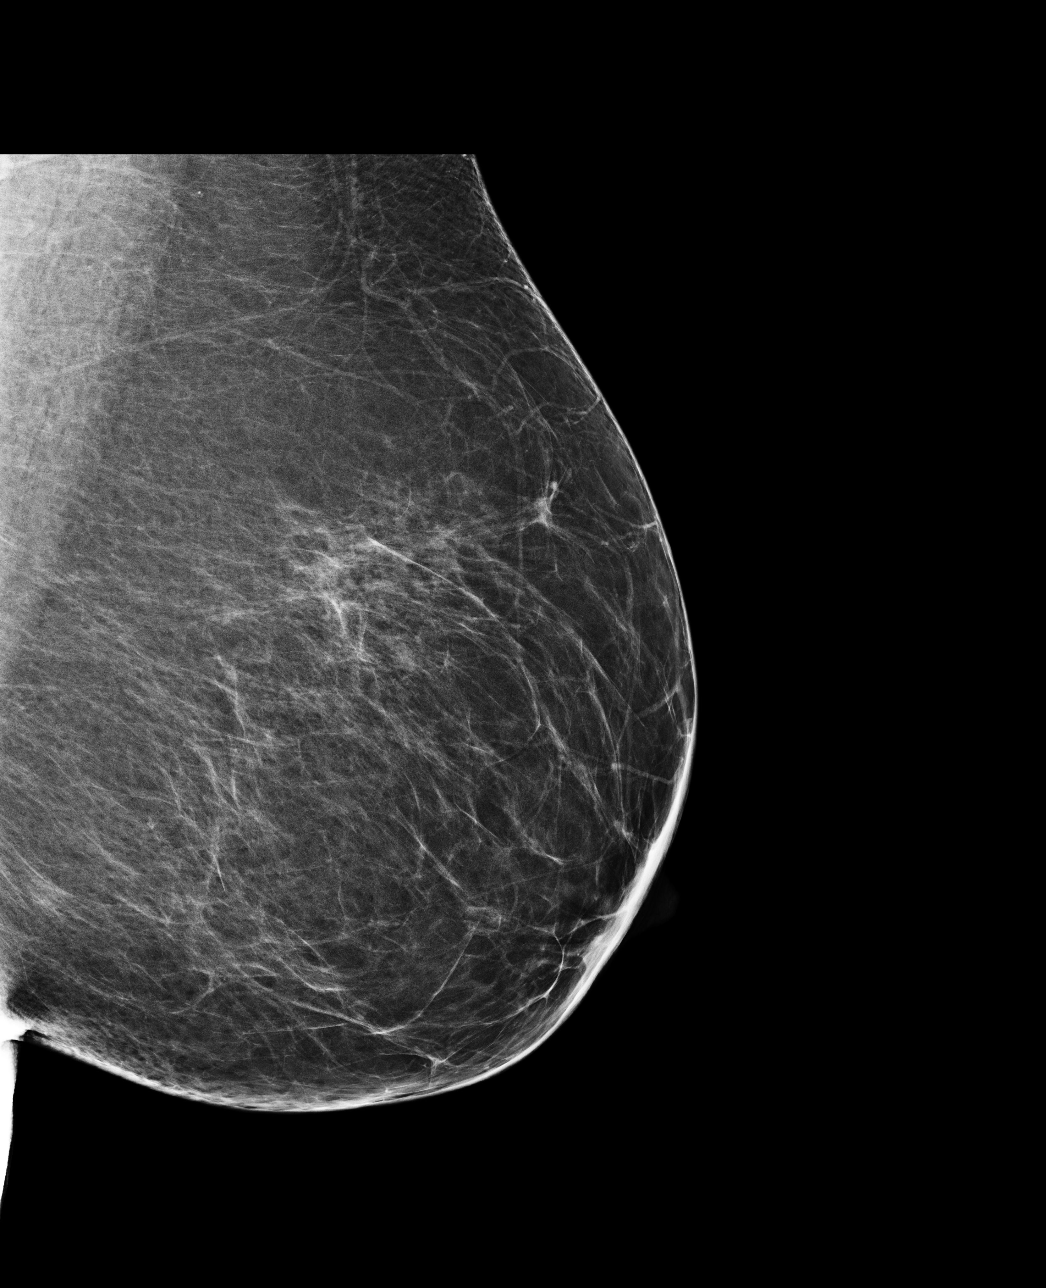

[4 of 4 positions shown; findings below may reference images not displayed]

FINDINGS: ACR Breast Density Category b:  There are scattered areas of
fibroglandular density.

There are no findings suspicious for malignancy.

Images were processed with CAD.
IMPRESSION: No mammographic evidence of malignancy.

A result letter of this screening mammogram will be mailed directly
to the patient.

RECOMMENDATION:
Screening mammogram in one year. (Code:[id number]-T-HFS)

BI-RADS CATEGORY 1:  Negative.

## 2014-09-27 ENCOUNTER — Encounter: Payer: Self-pay | Admitting: Internal Medicine

## 2014-11-01 ENCOUNTER — Encounter: Payer: Self-pay | Admitting: Internal Medicine

## 2014-11-07 ENCOUNTER — Telehealth: Payer: Self-pay | Admitting: Family Medicine

## 2014-11-07 NOTE — Telephone Encounter (Signed)
Pt is a previous Todd pt. Pt would like to know if you will accept her as a pt?

## 2014-11-07 NOTE — Telephone Encounter (Signed)
Pt states she will be flying to [place] and dr todd has prescribed ativan in the past. Would like to know if she could get refill. [facility] [address]

## 2014-11-08 MED ORDER — LORAZEPAM 1 MG PO TABS: ORAL_TABLET | ORAL | Status: DC

## 2014-11-08 NOTE — Telephone Encounter (Signed)
Rx called in per Dr Todd 

## 2014-11-19 NOTE — Telephone Encounter (Signed)
[place]  If ok with dr Sherren Mocha

## 2014-11-21 NOTE — Telephone Encounter (Signed)
Is this ok w/ you dt  Sherren Mocha?

## 2014-11-21 NOTE — Telephone Encounter (Signed)
Pt aware dr Regis Bill will see her Pt at work and will cb when she gets off to make appt w/ dr Regis Bill

## 2014-11-21 NOTE — Telephone Encounter (Signed)
Okay per Dr Sherren Mocha

## 2014-12-28 ENCOUNTER — Encounter (INDEPENDENT_AMBULATORY_CARE_PROVIDER_SITE_OTHER): Payer: Self-pay

## 2014-12-28 ENCOUNTER — Ambulatory Visit (AMBULATORY_SURGERY_CENTER): Payer: Self-pay

## 2014-12-28 VITALS — Ht 62.0 in | Wt 206.4 lb

## 2014-12-28 DIAGNOSIS — Z800 Family history of malignant neoplasm of digestive organs: Secondary | ICD-10-CM

## 2014-12-28 MED ORDER — SUPREP BOWEL PREP KIT 17.5-3.13-1.6 GM/177ML PO SOLN: 1.0000 | Freq: Once | ORAL | Status: DC

## 2014-12-28 NOTE — Progress Notes (Signed)
No allergies to eggs or soy No home oxygen No diet/weight loss meds No past problems with anesthesia  Has email  Emmi instructions given for colonoscopy 

## 2015-01-03 ENCOUNTER — Ambulatory Visit (AMBULATORY_SURGERY_CENTER): Payer: BC Managed Care – PPO | Admitting: Internal Medicine

## 2015-01-03 ENCOUNTER — Encounter: Payer: Self-pay | Admitting: Internal Medicine

## 2015-01-03 VITALS — BP 118/65 | HR 57 | Temp 96.7°F | Resp 22 | Ht 62.0 in | Wt 206.0 lb

## 2015-01-03 DIAGNOSIS — Z800 Family history of malignant neoplasm of digestive organs: Secondary | ICD-10-CM

## 2015-01-03 DIAGNOSIS — Z1211 Encounter for screening for malignant neoplasm of colon: Secondary | ICD-10-CM | POA: Diagnosis not present

## 2015-01-03 MED ORDER — SODIUM CHLORIDE 0.9 % IV SOLN: 500.0000 mL | INTRAVENOUS | Status: DC

## 2015-01-03 NOTE — Op Note (Signed)
[facility]  [address]. [place], [address]   COLONOSCOPY PROCEDURE REPORT  PATIENT: Rebekah, Fernandez  MR#: [id number] BIRTHDATE: 1951/01/30 , 63  yrs. old GENDER: female ENDOSCOPIST: Lafayette Dragon, MD REFERRED [id number] Todd, MD PROCEDURE DATE:  01/03/2015 PROCEDURE:   Colonoscopy, screening First Screening Colonoscopy - Avg.  risk and is 50 yrs.  old or older - No.  Prior Negative Screening - Now for repeat screening. Less than 10 yrs Prior Negative Screening - Now for repeat screening.  Above average risk  History of Adenoma - Now for follow-up colonoscopy & has been > or = to 3 yrs.  N/A  Polyps removed today? No ASA CLASS:   Class II INDICATIONS:Screening for colonic neoplasia, FH Colon or Rectal Adenocarcinoma, and 2 prior colonoscopies.  Last one September 2010 showed mild diverticulosis.  Positive family history of colon cancer in patient's mother. MEDICATIONS: Monitored anesthesia care and Propofol 250 mg IV  DESCRIPTION OF PROCEDURE:   After the risks benefits and alternatives of the procedure were thoroughly explained, informed consent was obtained.  The digital rectal exam revealed no abnormalities of the rectum.   The LB [id number]  endoscope was introduced through the anus and advanced to the cecum, which was identified by both the appendix and ileocecal valve. No adverse events experienced.   The quality of the prep was good.  (MoviPrep was used)  The instrument was then slowly withdrawn as the colon was fully examined. Estimated blood loss is zero unless otherwise noted in this procedure report.      COLON FINDINGS: There was moderate diverticulosis noted throughout the entire examined colon with associated muscular hypertrophy, tortuosity and angulation.  Retroflexed views revealed no abnormalities. The time to cecum = 5.02 Withdrawal time = 9.32 The scope was withdrawn and the procedure completed. COMPLICATIONS: There were no  immediate complications.  ENDOSCOPIC IMPRESSION: There was moderate diverticulosis noted throughout the entire examined colon  RECOMMENDATIONS: High-fiber diet Recall colonoscopy in 5 years  eSigned:  Lafayette Dragon, MD 01/03/2015 8:26 AM   cc:

## 2015-01-03 NOTE — Progress Notes (Signed)
Patient awakening,vss,report to rn 

## 2015-01-03 NOTE — Patient Instructions (Signed)
YOU HAD AN ENDOSCOPIC PROCEDURE TODAY AT [facility]:   Refer to the procedure report that was given to you for any specific questions about what was found during the examination.  If the procedure report does not answer your questions, please call your gastroenterologist to clarify.  If you requested that your care partner not be given the details of your procedure findings, then the procedure report has been included in a sealed envelope for you to review at your convenience later.  YOU SHOULD EXPECT: Some feelings of bloating in the abdomen. Passage of more gas than usual.  Walking can help get rid of the air that was put into your GI tract during the procedure and reduce the bloating. If you had a lower endoscopy (such as a colonoscopy or flexible sigmoidoscopy) you may notice spotting of blood in your stool or on the toilet paper. If you underwent a bowel prep for your procedure, you may not have a normal bowel movement for a few days.  Please Note:  You might notice some irritation and congestion in your nose or some drainage.  This is from the oxygen used during your procedure.  There is no need for concern and it should clear up in a day or so.  SYMPTOMS TO REPORT IMMEDIATELY:   Following lower endoscopy (colonoscopy or flexible sigmoidoscopy):  Excessive amounts of blood in the stool  Significant tenderness or worsening of abdominal pains  Swelling of the abdomen that is new, acute  Fever of 100F or higher   For urgent or emergent issues, a gastroenterologist can be reached at any hour by calling [phone].   DIET: Your first meal following the procedure should be a small meal and then it is ok to progress to your normal diet. Heavy or fried foods are harder to digest and may make you feel nauseous or bloated.  Likewise, meals heavy in dairy and vegetables can increase bloating.  Drink plenty of fluids but you should avoid alcoholic beverages for 24  hours.  ACTIVITY:  You should plan to take it easy for the rest of today and you should NOT DRIVE or use heavy machinery until tomorrow (because of the sedation medicines used during the test).    FOLLOW UP: Our staff will call the number listed on your records the next business day following your procedure to check on you and address any questions or concerns that you may have regarding the information given to you following your procedure. If we do not reach you, we will leave a message.  However, if you are feeling well and you are not experiencing any problems, there is no need to return our call.  We will assume that you have returned to your regular daily activities without incident.  If any biopsies were taken you will be contacted by phone or by letter within the next 1-3 weeks.  Please call us at [phone] if you have not heard about the biopsies in 3 weeks.    SIGNATURES/CONFIDENTIALITY: You and/or your care partner have signed paperwork which will be entered into your electronic medical record.  These signatures attest to the fact that that the information above on your After Visit Summary has been reviewed and is understood.  Full responsibility of the confidentiality of this discharge information lies with you and/or your care-partner.  Diverticulosis and high fiber diet information given. Next colonoscopy 5 years-2021

## 2015-01-04 ENCOUNTER — Telehealth: Payer: Self-pay | Admitting: *Deleted

## 2015-01-04 NOTE — Telephone Encounter (Signed)
  Follow up Call-  Call back number 01/03/2015  Post procedure Call Back phone  # [phone]  Permission to leave phone message Yes     Patient questions:  Message left to call us if necessary.

## 2015-01-19 ENCOUNTER — Other Ambulatory Visit: Payer: Self-pay | Admitting: Family Medicine

## 2015-02-14 ENCOUNTER — Other Ambulatory Visit: Payer: Self-pay | Admitting: Family Medicine

## 2015-04-25 ENCOUNTER — Other Ambulatory Visit: Payer: BC Managed Care – PPO

## 2015-05-02 ENCOUNTER — Encounter: Payer: BC Managed Care – PPO | Admitting: Family Medicine

## 2015-08-17 ENCOUNTER — Other Ambulatory Visit: Payer: Self-pay

## 2015-08-17 DIAGNOSIS — Z1231 Encounter for screening mammogram for malignant neoplasm of breast: Secondary | ICD-10-CM

## 2015-08-29 ENCOUNTER — Ambulatory Visit: Payer: BC Managed Care – PPO

## 2015-11-06 ENCOUNTER — Telehealth: Payer: Self-pay | Admitting: Family Medicine

## 2015-11-06 NOTE — Telephone Encounter (Signed)
Pt need new Rx for Zoloft,Losartan and Toprol.  Pharm:  [facility]

## 2015-11-08 MED ORDER — METOPROLOL SUCCINATE ER 50 MG PO TB24: ORAL_TABLET | ORAL | Status: AC

## 2015-11-08 MED ORDER — SERTRALINE HCL 25 MG PO TABS: 25.0000 mg | ORAL_TABLET | Freq: Every morning | ORAL | Status: DC

## 2015-11-08 MED ORDER — LOSARTAN POTASSIUM-HCTZ 50-12.5 MG PO TABS: ORAL_TABLET | ORAL | Status: AC

## 2015-11-12 ENCOUNTER — Telehealth: Payer: Self-pay | Admitting: Family Medicine

## 2015-11-12 NOTE — Telephone Encounter (Signed)
Patient's insurance no longer uses [facility].  A refill was offered to another pharmacy, but patient declined.  Patient states that she is trying to get in with a new provider and will call back as needed.

## 2015-11-12 NOTE — Telephone Encounter (Signed)
Pt is calling concerning the refill and have not heard anything.  Pl would like a call back.

## 2016-01-07 ENCOUNTER — Other Ambulatory Visit: Payer: BC Managed Care – PPO

## 2016-01-14 ENCOUNTER — Encounter: Payer: BC Managed Care – PPO | Admitting: Family Medicine

## 2016-05-26 ENCOUNTER — Other Ambulatory Visit: Payer: BC Managed Care – PPO

## 2016-06-03 ENCOUNTER — Encounter: Payer: BC Managed Care – PPO | Admitting: Family Medicine

## 2018-08-04 ENCOUNTER — Encounter: Payer: Self-pay | Admitting: Podiatry

## 2018-08-04 ENCOUNTER — Ambulatory Visit: Payer: BC Managed Care – PPO | Admitting: Podiatry

## 2018-08-04 ENCOUNTER — Other Ambulatory Visit: Payer: Self-pay | Admitting: Podiatry

## 2018-08-04 ENCOUNTER — Ambulatory Visit (INDEPENDENT_AMBULATORY_CARE_PROVIDER_SITE_OTHER): Payer: BC Managed Care – PPO

## 2018-08-04 VITALS — BP 136/82 | HR 65

## 2018-08-04 DIAGNOSIS — M79672 Pain in left foot: Secondary | ICD-10-CM

## 2018-08-04 DIAGNOSIS — M79671 Pain in right foot: Secondary | ICD-10-CM

## 2018-08-04 DIAGNOSIS — M21619 Bunion of unspecified foot: Secondary | ICD-10-CM | POA: Diagnosis not present

## 2018-08-04 DIAGNOSIS — M2041 Other hammer toe(s) (acquired), right foot: Secondary | ICD-10-CM

## 2018-08-04 DIAGNOSIS — L600 Ingrowing nail: Secondary | ICD-10-CM | POA: Diagnosis not present

## 2018-08-04 DIAGNOSIS — M2012 Hallux valgus (acquired), left foot: Secondary | ICD-10-CM

## 2018-08-04 MED ORDER — NEOMYCIN-POLYMYXIN-HC 3.5-10000-1 OT SOLN: OTIC | 0 refills | Status: DC

## 2018-08-04 NOTE — Patient Instructions (Addendum)
Bunion  A bunion is a bump on the base of the big toe that forms when the bones of the big toe joint move out of position. Bunions may be small at first, but they often get larger over time. They can make walking painful. What are the causes? A bunion may be caused by:  Wearing narrow or pointed shoes that force the big toe to press against the other toes.  Abnormal foot development that causes the foot to roll inward (pronate).  Changes in the foot that are caused by certain diseases, such as rheumatoid arthritis or polio.  A foot injury. What increases the risk? The following factors may make you more likely to develop this condition:  Wearing shoes that squeeze the toes together.  Having certain diseases, such as: ? Rheumatoid arthritis. ? Polio. ? Cerebral palsy.  Having family members who have bunions.  Being born with a foot deformity, such as flat feet or low arches.  Doing activities that put a lot of pressure on the feet, such as ballet dancing. What are the signs or symptoms? The main symptom of a bunion is a noticeable bump on the big toe. Other symptoms may include:  Pain.  Swelling around the big toe.  Redness and inflammation.  Thick or hardened skin on the big toe or between the toes.  Stiffness or loss of motion in the big toe.  Trouble with walking. How is this diagnosed? A bunion may be diagnosed based on your symptoms, medical history, and activities. You may have tests, such as:  X-rays. These allow your health care provider to check the position of the bones in your foot and look for damage to your joint. They also help your health care provider determine the severity of your bunion and the best way to treat it.  Joint aspiration. In this test, a sample of fluid is removed from the toe joint. This test may be done if you are in a lot of pain. It helps rule out diseases that cause painful swelling of the joints, such as arthritis. How is this  treated? Treatment depends on the severity of your symptoms. The goal of treatment is to relieve symptoms and prevent the bunion from getting worse. Your health care provider may recommend:  Wearing shoes that have a wide toe box.  Using bunion pads to cushion the affected area.  Taping your toes together to keep them in a normal position.  Placing a device inside your shoe (orthotics) to help reduce pressure on your toe joint.  Taking medicine to ease pain, inflammation, and swelling.  Applying heat or ice to the affected area.  Doing stretching exercises.  Surgery to remove scar tissue and move the toes back into their normal position. This treatment is rare. Follow these instructions at home: Managing pain, stiffness, and swelling   If directed, put ice on the painful area: ? Put ice in a plastic bag. ? Place a towel between your skin and the bag. ? Leave the ice on for 20 minutes, 2-3 times a day. Activity   If directed, apply heat to the affected area before you exercise. Use the heat source that your health care provider recommends, such as a moist heat pack or a heating pad. ? Place a towel between your skin and the heat source. ? Leave the heat on for 20-30 minutes. ? Remove the heat if your skin turns bright red. This is especially important if you are unable to feel pain,   heat, or cold. You may have a greater risk of getting burned.  Do exercises as told by your health care provider. General instructions  Support your toe joint with proper footwear, shoe padding, or taping as told by your health care provider.  Take over-the-counter and prescription medicines only as told by your health care provider.  Keep all follow-up visits as told by your health care provider. This is important. Contact a health care provider if your symptoms:  Get worse.  Do not improve in 2 weeks. Get help right away if you have:  Severe pain and trouble with walking. Summary  A  bunion is a bump on the base of the big toe that forms when the bones of the big toe joint move out of position.  Bunions can make walking painful.  Treatment depends on the severity of your symptoms.  Support your toe joint with proper footwear, shoe padding, or taping as told by your health care provider. This information is not intended to replace advice given to you by your health care provider. Make sure you discuss any questions you have with your health care provider. Document Released: 07/07/2005 Document Revised: 11/17/2017 Document Reviewed: 11/17/2017 Elsevier Interactive Patient Education  2019 [place] Toe  Hammer toe is a change in the shape (a deformity) of your toe. The deformity causes the middle joint of your toe to stay bent. This causes pain, especially when you are wearing shoes. Hammer toe starts gradually. At first, the toe can be straightened. Gradually over time, the deformity becomes stiff and permanent. Early treatments to keep the toe straight may relieve pain. As the deformity becomes stiff and permanent, surgery may be needed to straighten the toe. What are the causes? Hammer toe is caused by abnormal bending of the toe joint that is closest to your foot. It happens gradually over time. This pulls on the muscles and connections (tendons) of the toe joint, making them weak and stiff. It is often related to wearing shoes that are too short or narrow and do not let your toes straighten. What increases the risk? You may be at greater risk for hammer toe if you:  Are female.  Are older.  Wear shoes that are too small.  Wear high-heeled shoes that pinch your toes.  Are a Engineer, mining.  Have a second toe that is longer than your big toe (first toe).  Injure your foot or toe.  Have arthritis.  Have a family history of hammer toe.  Have a nerve or muscle disorder. What are the signs or symptoms? The main symptoms of this condition are  pain and deformity of the toe. The pain is worse when wearing shoes, walking, or running. Other symptoms may include:  Corns or calluses over the bent part of the toe or between the toes.  Redness and a burning feeling on the toe.  An open sore that forms on the top of the toe.  Not being able to straighten the toe. How is this diagnosed? This condition is diagnosed based on your symptoms and a physical exam. During the exam, your health care provider will try to straighten your toe to see how stiff the deformity is. You may also have tests, such as:  A blood test to check for rheumatoid arthritis.  An X-ray to show how severe the deformity is. How is this treated? Treatment for this condition will depend on how stiff the deformity is. Surgery is often needed.  However, sometimes a hammer toe can be straightened without surgery. Treatments that do not involve surgery include:  Taping the toe into a straightened position.  Using pads and cushions to protect the toe (orthotics).  Wearing shoes that provide enough room for the toes.  Doing toe-stretching exercises at home.  Taking an NSAID to reduce pain and swelling. If these treatments do not help or the toe cannot be straightened, surgery is the next option. The most common surgeries used to straighten a hammer toe include:  Arthroplasty. In this procedure, part of the joint is removed, and that allows the toe to straighten.  Fusion. In this procedure, cartilage between the two bones of the joint is taken out and the bones are fused together into one longer bone.  Implantation. In this procedure, part of the bone is removed and replaced with an implant to let the toe move again.  Flexor tendon transfer. In this procedure, the tendons that curl the toes down (flexor tendons) are repositioned. Follow these instructions at home:  Take over-the-counter and prescription medicines only as told by your health care provider.  Do toe  straightening and stretching exercises as told by your health care provider.  Keep all follow-up visits as told by your health care provider. This is important. How is this prevented?  Wear shoes that give your toes enough room and do not cause pain.  Do not wear high-heeled shoes. Contact a health care provider if:  Your pain gets worse.  Your toe becomes red or swollen.  You develop an open sore on your toe. This information is not intended to replace advice given to you by your health care provider. Make sure you discuss any questions you have with your health care provider. Document Released: 07/04/2000 Document Revised: 02/02/2017 Document Reviewed: 10/31/2015 Elsevier Interactive Patient Education  2019 [place] Instructions    THE DAY AFTER THE PROCEDURE  Place 1/4 cup of epsom salts in a quart of warm tap water.  Submerge your foot or feet with outer bandage intact for the initial soak; this will allow the bandage to become moist and wet for easy lift off.  Once you remove your bandage, continue to soak in the solution for 20 minutes.  This soak should be done twice a day.  Next, remove your foot or feet from solution, blot dry the affected area and cover.  You may use a band aid large enough to cover the area or use gauze and tape.  Apply other medications to the area as directed by the doctor such as polysporin neosporin.  IF YOUR SKIN BECOMES IRRITATED WHILE USING THESE INSTRUCTIONS, IT IS OKAY TO SWITCH TO  WHITE VINEGAR AND WATER. Or you may use antibacterial soap and water to keep the toe clean  Monitor for any signs/symptoms of infection. Call the office immediately if any occur or go directly to the emergency room. Call with any questions/concerns.    [place] Instructions-Post Nail Surgery  You have had your ingrown toenail and root treated with a chemical.  This chemical causes a burn that will drain and ooze like a blister.  This can drain for  6-8 weeks or longer.  It is important to keep this area clean, covered, and follow the soaking instructions dispensed at the time of your surgery.  This area will eventually dry and form a scab.  Once the scab forms you no longer need to soak or apply a dressing.  If at  any time you experience an increase in pain, redness, swelling, or drainage, you should contact the office as soon as possible.

## 2018-08-04 NOTE — Progress Notes (Signed)
Subjective:   Patient ID: Rebekah Fernandez, female   DOB: 68 y.o.   MRN: [id number]   HPI Patient states she continues to develop more symptoms with her bunion left and it increasingly difficult to find shoe gear that fits it properly and also complains of ingrown toenail left and also has several digits that are out of position that while they are not hurting she was concerned about right over left foot.  Patient does not smoke and likes to be active   Review of Systems  All other systems reviewed and are negative.       Objective:  Physical Exam Vitals signs and nursing note reviewed.  Constitutional:      Appearance: She is well-developed.  Pulmonary:     Effort: Pulmonary effort is normal.  Musculoskeletal: Normal range of motion.  Skin:    General: Skin is warm.  Neurological:     Mental Status: She is alert.     Neurovascular status intact muscle strength is adequate range of motion within normal limits with patient noted to have an incurvated medial border of the left hallux that is painful with no drainage or redness noted.  Has deformity of the left first metatarsal with large prominence of the first metatarsal head medial side and does have mild to moderate change in the digits right over left with medial rotation of the toes.  Patient's found to have good digital perfusion is well oriented x3     Assessment:  Ingrown toenail deformity left hallux medial border along with structural bunion deformity left symptomatic and hammertoe deformity nonsymptomatic     Plan:  H&P x-rays reviewed with patient and discussed bunion and she wants to get this fixed but will most likely wait till her son is married in May will have it done after that.  May decide to do it right away and was given instructions on procedure.  Discussed correction of the ingrown toenail she wants to have this fixed and I explained procedure risk and at this point I allowed her to read consent form for  correction.  I explained the procedure and she signed consent form and today I infiltrated the left hallux 60 mg like Marcaine mixture sterile prep applied and using sterile instrumentation I remove the medial border exposed matrix and applied phenol 3 applications 30 seconds followed by alcohol lavage and sterile dressing.  Gave instructions on soaks and wrote prescription for Corticosporin otic solution and instructed her to leave dressing on 24 hours but to take it off earlier if any issues should occur.  Will reappoint in 2 weeks to check

## 2018-08-18 ENCOUNTER — Ambulatory Visit (INDEPENDENT_AMBULATORY_CARE_PROVIDER_SITE_OTHER): Payer: Self-pay

## 2018-08-18 DIAGNOSIS — L600 Ingrowing nail: Secondary | ICD-10-CM

## 2018-08-18 NOTE — Patient Instructions (Signed)

## 2018-08-24 NOTE — Progress Notes (Signed)
Patient is here today for follow-up appointment, recent procedure performed 08/04/2018, removal of ingrown toenail left hallux medial border.  She states that she is having any issues with the area, and believes that is healing well and is having no pain at this time.  No redness, no erythema, no swelling, no drainage, no other signs and symptoms of infection.  Area is scabbed over and healing well at this time.  Discussed signs and symptoms of infection, verbal and written instructions were given to the patient.  She is to follow-up as needed with any acute symptom changes.

## 2019-05-03 ENCOUNTER — Other Ambulatory Visit: Payer: Self-pay

## 2019-05-03 DIAGNOSIS — Z20822 Contact with and (suspected) exposure to covid-19: Secondary | ICD-10-CM

## 2019-05-04 LAB — NOVEL CORONAVIRUS, NAA: SARS-CoV-2, NAA: NOT DETECTED

## 2019-05-11 ENCOUNTER — Other Ambulatory Visit: Payer: Self-pay

## 2019-05-11 DIAGNOSIS — Z20822 Contact with and (suspected) exposure to covid-19: Secondary | ICD-10-CM

## 2019-05-12 LAB — NOVEL CORONAVIRUS, NAA: SARS-CoV-2, NAA: NOT DETECTED

## 2019-05-30 ENCOUNTER — Other Ambulatory Visit: Payer: Self-pay

## 2019-05-30 DIAGNOSIS — Z20822 Contact with and (suspected) exposure to covid-19: Secondary | ICD-10-CM

## 2019-05-31 LAB — NOVEL CORONAVIRUS, NAA: SARS-CoV-2, NAA: NOT DETECTED

## 2019-07-13 ENCOUNTER — Telehealth: Payer: Self-pay | Admitting: Unknown Physician Specialty

## 2019-07-13 NOTE — Telephone Encounter (Signed)
Called to discuss with patient about Covid symptoms and the use of bamlanivimab, a monoclonal antibody infusion for those with mild to moderate Covid symptoms and at a high risk of hospitalization.  Pt is qualified for this infusion at the [facility] due to Age > 65   Message left to call back  

## 2019-08-10 ENCOUNTER — Ambulatory Visit: Payer: BC Managed Care – PPO | Attending: Internal Medicine

## 2019-08-10 ENCOUNTER — Other Ambulatory Visit: Payer: Self-pay

## 2019-08-10 DIAGNOSIS — Z23 Encounter for immunization: Secondary | ICD-10-CM | POA: Diagnosis present

## 2019-08-10 NOTE — Progress Notes (Signed)
   Covid-19 Vaccination Clinic  Name:  Rebekah Fernandez    MRN: [id number] DOB: 10-Aug-1950  08/10/2019  [name] was observed post Covid-19 immunization for 15 minutes without incidence. She was provided with Vaccine Information Sheet and instruction to access the V-Safe system.   [name] was instructed to call 911 with any severe reactions post vaccine: Marland Kitchen Difficulty breathing  . Swelling of your face and throat  . A fast heartbeat  . A bad rash all over your body  . Dizziness and weakness    Immunizations Administered    Name Date Dose VIS Date Route   Pfizer COVID-19 Vaccine 08/10/2019  2:38 PM 0.3 mL 07/01/2019 Intramuscular   Manufacturer: [place]   Lot: [id number]   [place]: [id number]

## 2019-08-19 ENCOUNTER — Ambulatory Visit (INDEPENDENT_AMBULATORY_CARE_PROVIDER_SITE_OTHER): Payer: BC Managed Care – PPO

## 2019-08-19 ENCOUNTER — Other Ambulatory Visit: Payer: Self-pay | Admitting: Podiatry

## 2019-08-19 ENCOUNTER — Encounter: Payer: Self-pay | Admitting: Podiatry

## 2019-08-19 ENCOUNTER — Ambulatory Visit: Payer: BC Managed Care – PPO | Admitting: Podiatry

## 2019-08-19 ENCOUNTER — Other Ambulatory Visit: Payer: Self-pay

## 2019-08-19 ENCOUNTER — Ambulatory Visit: Payer: BC Managed Care – PPO

## 2019-08-19 DIAGNOSIS — M76829 Posterior tibial tendinitis, unspecified leg: Secondary | ICD-10-CM | POA: Diagnosis not present

## 2019-08-19 DIAGNOSIS — M76822 Posterior tibial tendinitis, left leg: Secondary | ICD-10-CM | POA: Diagnosis not present

## 2019-08-19 DIAGNOSIS — L600 Ingrowing nail: Secondary | ICD-10-CM | POA: Diagnosis not present

## 2019-08-19 DIAGNOSIS — M21619 Bunion of unspecified foot: Secondary | ICD-10-CM

## 2019-08-19 DIAGNOSIS — M2011 Hallux valgus (acquired), right foot: Secondary | ICD-10-CM

## 2019-08-19 MED ORDER — NEOMYCIN-POLYMYXIN-HC 3.5-10000-1 OT SOLN: OTIC | 0 refills | Status: DC

## 2019-08-19 NOTE — Patient Instructions (Signed)

## 2019-08-19 NOTE — Progress Notes (Signed)
Subjective:   Patient ID: Rebekah Fernandez, female   DOB: 69 y.o.   MRN: [id number]   HPI Patient states the nail has started to bother me again and there is a spicule there that can be irritative when I try to cut it and also getting some pain in my midfoot and the bunion is not really bothering me and also concerned about my hammertoes   ROS      Objective:  Physical Exam  Neurovascular status intact with small incurvation of the left hallux medial side with a spicule which is growing abnormally with patient noted to having some discomfort posterior tibial insertion navicular left and right with depression of the arch moderate bunion and hammertoe deformity second and third digits bilateral     Assessment:  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  Patient understands soaks and will be seen back to recheck and we will not treat the posterior tibial tendinitis but it may be necessary one point in future and bunions and hammertoes will be watched as they are nonsymptomatic  X-rays indicate that there is moderate bunion deformity left localized with no other pathology noted and mild depression of the arch

## 2019-08-20 ENCOUNTER — Ambulatory Visit: Payer: BC Managed Care – PPO

## 2019-08-31 ENCOUNTER — Ambulatory Visit: Payer: BC Managed Care – PPO

## 2019-08-31 ENCOUNTER — Ambulatory Visit: Payer: BC Managed Care – PPO | Attending: Internal Medicine

## 2019-08-31 DIAGNOSIS — Z23 Encounter for immunization: Secondary | ICD-10-CM

## 2019-08-31 NOTE — Progress Notes (Signed)
   Covid-19 Vaccination Clinic  Name:  Rebekah Fernandez    MRN: [id number] DOB: August 06, 1950  08/31/2019  Ms. Rebekah Fernandez was observed post Covid-19 immunization for 15 minutes without incidence. She was provided with Vaccine Information Sheet and instruction to access the V-Safe system.    Ms. Rebekah Fernandez was instructed to call 911 with any severe reactions post vaccine: Marland Kitchen Difficulty breathing  . Swelling of your face and throat  . A fast heartbeat  . A bad rash all over your body  . Dizziness and weakness   Pt stated some dizziness and warmth within the first few mins pt stated that she feels fine now  Immunizations Administered    Name Date Dose VIS Date Route   Pfizer COVID-19 Vaccine 08/31/2019 12:51 PM 0.3 mL 07/01/2019 Intramuscular   Manufacturer: [place]   Lot: [id number]   [place]: [id number]

## 2019-12-27 ENCOUNTER — Encounter: Payer: Self-pay | Admitting: Gastroenterology

## 2020-01-24 ENCOUNTER — Encounter: Payer: Self-pay | Admitting: Gastroenterology

## 2020-03-23 ENCOUNTER — Ambulatory Visit (AMBULATORY_SURGERY_CENTER): Payer: Self-pay | Admitting: *Deleted

## 2020-03-23 ENCOUNTER — Other Ambulatory Visit: Payer: Self-pay

## 2020-03-23 VITALS — Ht 62.0 in | Wt 200.0 lb

## 2020-03-23 DIAGNOSIS — Z800 Family history of malignant neoplasm of digestive organs: Secondary | ICD-10-CM

## 2020-03-23 MED ORDER — SUTAB 1479-225-188 MG PO TABS: 1.0000 | ORAL_TABLET | Freq: Once | ORAL | 0 refills | Status: AC

## 2020-03-23 NOTE — Progress Notes (Signed)

## 2020-03-28 ENCOUNTER — Encounter: Payer: Self-pay | Admitting: Gastroenterology

## 2020-04-06 ENCOUNTER — Encounter: Payer: BC Managed Care – PPO | Admitting: Gastroenterology

## 2020-05-21 ENCOUNTER — Encounter: Payer: Self-pay | Admitting: Gastroenterology

## 2020-05-21 ENCOUNTER — Ambulatory Visit (AMBULATORY_SURGERY_CENTER): Payer: BC Managed Care – PPO | Admitting: Gastroenterology

## 2020-05-21 ENCOUNTER — Other Ambulatory Visit: Payer: Self-pay

## 2020-05-21 VITALS — BP 126/76 | HR 53 | Temp 97.8°F | Resp 11 | Ht 62.0 in | Wt 200.0 lb

## 2020-05-21 DIAGNOSIS — Z1211 Encounter for screening for malignant neoplasm of colon: Secondary | ICD-10-CM

## 2020-05-21 DIAGNOSIS — D122 Benign neoplasm of ascending colon: Secondary | ICD-10-CM | POA: Diagnosis not present

## 2020-05-21 DIAGNOSIS — Z800 Family history of malignant neoplasm of digestive organs: Secondary | ICD-10-CM | POA: Diagnosis not present

## 2020-05-21 MED ORDER — SODIUM CHLORIDE 0.9 % IV SOLN: 500.0000 mL | Freq: Once | INTRAVENOUS | Status: DC

## 2020-05-21 NOTE — Patient Instructions (Signed)
Handouts given for polyps, diverticulosis and high fiber diet.  YOU HAD AN ENDOSCOPIC PROCEDURE TODAY AT [facility]:   Refer to the procedure report that was given to you for any specific questions about what was found during the examination.  If the procedure report does not answer your questions, please call your gastroenterologist to clarify.  If you requested that your care partner not be given the details of your procedure findings, then the procedure report has been included in a sealed envelope for you to review at your convenience later.  YOU SHOULD EXPECT: Some feelings of bloating in the abdomen. Passage of more gas than usual.  Walking can help get rid of the air that was put into your GI tract during the procedure and reduce the bloating. If you had a lower endoscopy (such as a colonoscopy or flexible sigmoidoscopy) you may notice spotting of blood in your stool or on the toilet paper. If you underwent a bowel prep for your procedure, you may not have a normal bowel movement for a few days.  Please Note:  You might notice some irritation and congestion in your nose or some drainage.  This is from the oxygen used during your procedure.  There is no need for concern and it should clear up in a day or so.  SYMPTOMS TO REPORT IMMEDIATELY:   Following lower endoscopy (colonoscopy or flexible sigmoidoscopy):  Excessive amounts of blood in the stool  Significant tenderness or worsening of abdominal pains  Swelling of the abdomen that is new, acute  Fever of 100F or higher  For urgent or emergent issues, a gastroenterologist can be reached at any hour by calling [phone]. Do not use MyChart messaging for urgent concerns.    DIET:  We do recommend a small meal at first, but then you may proceed to your regular diet.  Drink plenty of fluids but you should avoid alcoholic beverages for 24 hours.  ACTIVITY:  You should plan to take it easy for the rest of today and you  should NOT DRIVE or use heavy machinery until tomorrow (because of the sedation medicines used during the test).    FOLLOW UP: Our staff will call the number listed on your records 48-72 hours following your procedure to check on you and address any questions or concerns that you may have regarding the information given to you following your procedure. If we do not reach you, we will leave a message.  We will attempt to reach you two times.  During this call, we will ask if you have developed any symptoms of COVID 19. If you develop any symptoms (ie: fever, flu-like symptoms, shortness of breath, cough etc.) before then, please call [phone].  If you test positive for Covid 19 in the 2 weeks post procedure, please call and report this information to [place].    If any biopsies were taken you will be contacted by phone or by letter within the next 1-3 weeks.  Please call us at [phone] if you have not heard about the biopsies in 3 weeks.    SIGNATURES/CONFIDENTIALITY: You and/or your care partner have signed paperwork which will be entered into your electronic medical record.  These signatures attest to the fact that that the information above on your After Visit Summary has been reviewed and is understood.  Full responsibility of the confidentiality of this discharge information lies with you and/or your care-partner.

## 2020-05-21 NOTE — Progress Notes (Signed)
Vs by Delos Haring RN in adm  Pt's states no medical or surgical changes since previsit or office visit.

## 2020-05-21 NOTE — Op Note (Signed)
[place] Patient Name: Rebekah Fernandez Procedure Date: 05/21/2020 10:32 AM MRN: [id number] Endoscopist: Thornton Park MD, MD Age: 69 Referring MD:  Date of Birth: February 06, 1951 Gender: Female Account #: [id number] Procedure:                Colonoscopy Indications:              Screening patient at increased risk: Family history                            of 1st-degree relative with colorectal cancer at                            age 37 years (or older)                           No polyps on prior colonoscopies in 2005, 2010, 2016                           Mother with colon cancer at age 57 Medicines:                Monitored Anesthesia Care Procedure:                Pre-Anesthesia Assessment:                           - Prior to the procedure, a History and Physical                            was performed, and patient medications and                            allergies were reviewed. The patient's tolerance of                            previous anesthesia was also reviewed. The risks                            and benefits of the procedure and the sedation                            options and risks were discussed with the patient.                            All questions were answered, and informed consent                            was obtained. Prior Anticoagulants: The patient has                            taken no previous anticoagulant or antiplatelet                            agents. ASA Grade Assessment: II - A patient with  mild systemic disease. After reviewing the risks                            and benefits, the patient was deemed in                            satisfactory condition to undergo the procedure.                           After obtaining informed consent, the colonoscope                            was passed under direct vision. Throughout the                            procedure, the patient's blood pressure, pulse,  and                            oxygen saturations were monitored continuously. The                            Colonoscope was introduced through the anus and                            advanced to the 3 cm into the ileum. A second                            forward view of the right colon was performed. The                            colonoscopy was performed without difficulty. The                            patient tolerated the procedure well. The quality                            of the bowel preparation was good. The terminal                            ileum, ileocecal valve, appendiceal orifice, and                            rectum were photographed. Scope In: 10:41:25 AM Scope Out: 10:54:14 AM Scope Withdrawal Time: 0 hours 9 minutes 42 seconds  Total Procedure Duration: 0 hours 12 minutes 49 seconds  Findings:                 The perianal and digital rectal examinations were                            normal.                           Multiple small and large-mouthed diverticula were  found in the sigmoid colon and descending colon.                           A 3 mm polyp was found in the ascending colon. The                            polyp was flat. The polyp was removed with a cold                            snare. Resection and retrieval were complete.                            Estimated blood loss was minimal.                           The exam was otherwise without abnormality on                            direct and retroflexion views. Complications:            No immediate complications. Estimated blood loss:                            Minimal. Estimated Blood Loss:     Estimated blood loss was minimal. Impression:               - Diverticulosis in the sigmoid colon and in the                            descending colon.                           - One 3 mm polyp in the ascending colon, removed                            with a cold snare.  Resected and retrieved.                           - The examination was otherwise normal on direct                            and retroflexion views. Recommendation:           - Patient has a contact number available for                            emergencies. The signs and symptoms of potential                            delayed complications were discussed with the                            patient. Return to normal activities tomorrow.  Written discharge instructions were provided to the                            patient.                           - Follow a high fiber diet. Drink at least 64                            ounces of water daily. Add a daily stool bulking                            agent such as psyllium (an exampled would be                            Metamucil).                           - Continue present medications.                           - Await pathology results.                           - Repeat colonoscopy in 5 years for surveillance.                           - Emerging evidence supports eating a diet of                            fruits, vegetables, grains, calcium, and yogurt                            while reducing red meat and alcohol may reduce the                            risk of colon cancer.                           - Thank you for allowing me to be involved in your                            colon cancer prevention. Thornton Park MD, MD 05/21/2020 10:59:53 AM This report has been signed electronically.

## 2020-05-21 NOTE — Progress Notes (Signed)
To PACU, VSS. Report to [email or web address] 

## 2020-05-21 NOTE — Progress Notes (Signed)
Called to room to assist during endoscopic procedure.  Patient ID and intended procedure confirmed with present staff. Received instructions for my participation in the procedure from the performing physician.  

## 2020-05-23 ENCOUNTER — Telehealth: Payer: Self-pay

## 2020-05-23 NOTE — Telephone Encounter (Signed)
  Follow up Call-  Call back number 05/21/2020  Post procedure Call Back phone  # [phone]  Permission to leave phone message Yes  Some recent data might be hidden     Patient questions:  Do you have a fever, pain , or abdominal swelling? No. Pain Score  0 *  Have you tolerated food without any problems? Yes.    Have you been able to return to your normal activities? Yes.    Do you have any questions about your discharge instructions: Diet   No. Medications  No. Follow up visit  No.  Do you have questions or concerns about your Care? No.  Actions: * If pain score is 4 or above: No action needed, pain <4. 1. Have you developed a fever since your procedure? no  2.   Have you had an respiratory symptoms (SOB or cough) since your procedure? no  3.   Have you tested positive for COVID 19 since your procedure no  4.   Have you had any family members/close contacts diagnosed with the COVID 19 since your procedure?  no   If yes to any of these questions please route to Joylene John, RN and Joella Prince, RN

## 2020-05-24 ENCOUNTER — Encounter: Payer: Self-pay | Admitting: Gastroenterology

## 2020-07-27 ENCOUNTER — Ambulatory Visit: Payer: Self-pay | Attending: Internal Medicine

## 2020-07-27 DIAGNOSIS — Z23 Encounter for immunization: Secondary | ICD-10-CM

## 2020-07-27 NOTE — Progress Notes (Signed)
   Covid-19 Vaccination Clinic  Name:  JASELLE PRYER    MRN: [id number] DOB: Mar 09, 1951  07/27/2020  Ms. Shambley was observed post Covid-19 immunization for 15 minutes without incident. She was provided with Vaccine Information Sheet and instruction to access the V-Safe system.   Ms. Beauchesne was instructed to call 911 with any severe reactions post vaccine: Marland Kitchen Difficulty breathing  . Swelling of face and throat  . A fast heartbeat  . A bad rash all over body  . Dizziness and weakness   Immunizations Administered    Name Date Dose VIS Date Route   Pfizer COVID-19 Vaccine 07/27/2020  5:30 PM 0.3 mL 05/09/2020 Intramuscular   Manufacturer: [place]   Lot: [id number]   [place]: [id number]

## 2021-12-23 ENCOUNTER — Other Ambulatory Visit: Payer: Self-pay | Admitting: Obstetrics and Gynecology

## 2022-06-05 DIAGNOSIS — Z23 Encounter for immunization: Secondary | ICD-10-CM | POA: Diagnosis not present

## 2022-08-07 DIAGNOSIS — I10 Essential (primary) hypertension: Secondary | ICD-10-CM | POA: Diagnosis not present

## 2022-08-07 DIAGNOSIS — R7301 Impaired fasting glucose: Secondary | ICD-10-CM | POA: Diagnosis not present

## 2022-08-14 DIAGNOSIS — R69 Illness, unspecified: Secondary | ICD-10-CM | POA: Diagnosis not present

## 2022-08-14 DIAGNOSIS — Z86010 Personal history of colonic polyps: Secondary | ICD-10-CM | POA: Diagnosis not present

## 2022-08-14 DIAGNOSIS — Z23 Encounter for immunization: Secondary | ICD-10-CM | POA: Diagnosis not present

## 2022-08-14 DIAGNOSIS — F419 Anxiety disorder, unspecified: Secondary | ICD-10-CM | POA: Diagnosis not present

## 2022-08-14 DIAGNOSIS — R82998 Other abnormal findings in urine: Secondary | ICD-10-CM | POA: Diagnosis not present

## 2022-08-14 DIAGNOSIS — I10 Essential (primary) hypertension: Secondary | ICD-10-CM | POA: Diagnosis not present

## 2022-08-14 DIAGNOSIS — Z0000 Encounter for general adult medical examination without abnormal findings: Secondary | ICD-10-CM | POA: Diagnosis not present

## 2022-08-14 DIAGNOSIS — M179 Osteoarthritis of knee, unspecified: Secondary | ICD-10-CM | POA: Diagnosis not present

## 2022-08-14 DIAGNOSIS — Z1331 Encounter for screening for depression: Secondary | ICD-10-CM | POA: Diagnosis not present

## 2022-08-14 DIAGNOSIS — R7301 Impaired fasting glucose: Secondary | ICD-10-CM | POA: Diagnosis not present

## 2022-08-14 DIAGNOSIS — Z1339 Encounter for screening examination for other mental health and behavioral disorders: Secondary | ICD-10-CM | POA: Diagnosis not present

## 2022-08-14 DIAGNOSIS — M25512 Pain in left shoulder: Secondary | ICD-10-CM | POA: Diagnosis not present

## 2022-10-22 DIAGNOSIS — Z0100 Encounter for examination of eyes and vision without abnormal findings: Secondary | ICD-10-CM | POA: Diagnosis not present

## 2022-10-23 DIAGNOSIS — H5213 Myopia, bilateral: Secondary | ICD-10-CM | POA: Diagnosis not present

## 2022-11-11 DIAGNOSIS — R051 Acute cough: Secondary | ICD-10-CM | POA: Diagnosis not present

## 2022-11-11 DIAGNOSIS — R0981 Nasal congestion: Secondary | ICD-10-CM | POA: Diagnosis not present

## 2022-11-11 DIAGNOSIS — Z1152 Encounter for screening for COVID-19: Secondary | ICD-10-CM | POA: Diagnosis not present

## 2022-11-11 DIAGNOSIS — J029 Acute pharyngitis, unspecified: Secondary | ICD-10-CM | POA: Diagnosis not present

## 2022-11-11 DIAGNOSIS — R6883 Chills (without fever): Secondary | ICD-10-CM | POA: Diagnosis not present

## 2022-11-11 DIAGNOSIS — I10 Essential (primary) hypertension: Secondary | ICD-10-CM | POA: Diagnosis not present

## 2022-11-11 DIAGNOSIS — J069 Acute upper respiratory infection, unspecified: Secondary | ICD-10-CM | POA: Diagnosis not present

## 2022-12-04 DIAGNOSIS — R635 Abnormal weight gain: Secondary | ICD-10-CM | POA: Diagnosis not present

## 2022-12-18 DIAGNOSIS — I10 Essential (primary) hypertension: Secondary | ICD-10-CM | POA: Diagnosis not present

## 2022-12-18 DIAGNOSIS — F39 Unspecified mood [affective] disorder: Secondary | ICD-10-CM | POA: Diagnosis not present

## 2022-12-18 DIAGNOSIS — Z6839 Body mass index (BMI) 39.0-39.9, adult: Secondary | ICD-10-CM | POA: Diagnosis not present

## 2022-12-18 DIAGNOSIS — F5089 Other specified eating disorder: Secondary | ICD-10-CM | POA: Diagnosis not present

## 2022-12-18 DIAGNOSIS — R7303 Prediabetes: Secondary | ICD-10-CM | POA: Diagnosis not present

## 2022-12-29 DIAGNOSIS — R7303 Prediabetes: Secondary | ICD-10-CM | POA: Diagnosis not present

## 2022-12-29 DIAGNOSIS — Z6839 Body mass index (BMI) 39.0-39.9, adult: Secondary | ICD-10-CM | POA: Diagnosis not present

## 2022-12-29 DIAGNOSIS — E669 Obesity, unspecified: Secondary | ICD-10-CM | POA: Diagnosis not present

## 2022-12-29 DIAGNOSIS — Z713 Dietary counseling and surveillance: Secondary | ICD-10-CM | POA: Diagnosis not present

## 2022-12-29 DIAGNOSIS — I10 Essential (primary) hypertension: Secondary | ICD-10-CM | POA: Diagnosis not present

## 2023-01-16 DIAGNOSIS — Z6839 Body mass index (BMI) 39.0-39.9, adult: Secondary | ICD-10-CM | POA: Diagnosis not present

## 2023-01-16 DIAGNOSIS — E669 Obesity, unspecified: Secondary | ICD-10-CM | POA: Diagnosis not present

## 2023-01-16 DIAGNOSIS — Z713 Dietary counseling and surveillance: Secondary | ICD-10-CM | POA: Diagnosis not present

## 2023-01-26 DIAGNOSIS — Z6839 Body mass index (BMI) 39.0-39.9, adult: Secondary | ICD-10-CM | POA: Diagnosis not present

## 2023-01-26 DIAGNOSIS — R7303 Prediabetes: Secondary | ICD-10-CM | POA: Diagnosis not present

## 2023-01-26 DIAGNOSIS — E669 Obesity, unspecified: Secondary | ICD-10-CM | POA: Diagnosis not present

## 2023-01-26 DIAGNOSIS — I10 Essential (primary) hypertension: Secondary | ICD-10-CM | POA: Diagnosis not present

## 2023-01-26 DIAGNOSIS — Z713 Dietary counseling and surveillance: Secondary | ICD-10-CM | POA: Diagnosis not present

## 2023-03-13 DIAGNOSIS — M25571 Pain in right ankle and joints of right foot: Secondary | ICD-10-CM | POA: Diagnosis not present

## 2023-03-16 DIAGNOSIS — E669 Obesity, unspecified: Secondary | ICD-10-CM | POA: Diagnosis not present

## 2023-03-16 DIAGNOSIS — Z6839 Body mass index (BMI) 39.0-39.9, adult: Secondary | ICD-10-CM | POA: Diagnosis not present

## 2023-03-30 DIAGNOSIS — Z6839 Body mass index (BMI) 39.0-39.9, adult: Secondary | ICD-10-CM | POA: Diagnosis not present

## 2023-03-30 DIAGNOSIS — E669 Obesity, unspecified: Secondary | ICD-10-CM | POA: Diagnosis not present

## 2023-03-30 DIAGNOSIS — Z713 Dietary counseling and surveillance: Secondary | ICD-10-CM | POA: Diagnosis not present

## 2023-05-08 DIAGNOSIS — Z7182 Exercise counseling: Secondary | ICD-10-CM | POA: Diagnosis not present

## 2023-05-08 DIAGNOSIS — E669 Obesity, unspecified: Secondary | ICD-10-CM | POA: Diagnosis not present

## 2023-05-08 DIAGNOSIS — Z6839 Body mass index (BMI) 39.0-39.9, adult: Secondary | ICD-10-CM | POA: Diagnosis not present

## 2023-05-19 DIAGNOSIS — L821 Other seborrheic keratosis: Secondary | ICD-10-CM | POA: Diagnosis not present

## 2023-05-19 DIAGNOSIS — L570 Actinic keratosis: Secondary | ICD-10-CM | POA: Diagnosis not present

## 2023-05-19 DIAGNOSIS — L814 Other melanin hyperpigmentation: Secondary | ICD-10-CM | POA: Diagnosis not present

## 2023-05-19 DIAGNOSIS — D2262 Melanocytic nevi of left upper limb, including shoulder: Secondary | ICD-10-CM | POA: Diagnosis not present

## 2023-05-19 DIAGNOSIS — D1801 Hemangioma of skin and subcutaneous tissue: Secondary | ICD-10-CM | POA: Diagnosis not present

## 2023-06-29 DIAGNOSIS — Z1152 Encounter for screening for COVID-19: Secondary | ICD-10-CM | POA: Diagnosis not present

## 2023-06-29 DIAGNOSIS — R0981 Nasal congestion: Secondary | ICD-10-CM | POA: Diagnosis not present

## 2023-06-29 DIAGNOSIS — R058 Other specified cough: Secondary | ICD-10-CM | POA: Diagnosis not present

## 2023-06-29 DIAGNOSIS — R5383 Other fatigue: Secondary | ICD-10-CM | POA: Diagnosis not present

## 2023-08-19 DIAGNOSIS — I10 Essential (primary) hypertension: Secondary | ICD-10-CM | POA: Diagnosis not present

## 2023-08-19 DIAGNOSIS — R7989 Other specified abnormal findings of blood chemistry: Secondary | ICD-10-CM | POA: Diagnosis not present

## 2023-08-19 DIAGNOSIS — Z79899 Other long term (current) drug therapy: Secondary | ICD-10-CM | POA: Diagnosis not present

## 2023-08-19 DIAGNOSIS — R7301 Impaired fasting glucose: Secondary | ICD-10-CM | POA: Diagnosis not present

## 2023-08-26 ENCOUNTER — Ambulatory Visit: Payer: HMO | Admitting: Podiatry

## 2023-08-26 ENCOUNTER — Encounter: Payer: Self-pay | Admitting: Podiatry

## 2023-08-26 ENCOUNTER — Ambulatory Visit (INDEPENDENT_AMBULATORY_CARE_PROVIDER_SITE_OTHER): Payer: HMO

## 2023-08-26 DIAGNOSIS — F419 Anxiety disorder, unspecified: Secondary | ICD-10-CM | POA: Diagnosis not present

## 2023-08-26 DIAGNOSIS — Z1339 Encounter for screening examination for other mental health and behavioral disorders: Secondary | ICD-10-CM | POA: Diagnosis not present

## 2023-08-26 DIAGNOSIS — L600 Ingrowing nail: Secondary | ICD-10-CM | POA: Diagnosis not present

## 2023-08-26 DIAGNOSIS — R7301 Impaired fasting glucose: Secondary | ICD-10-CM | POA: Diagnosis not present

## 2023-08-26 DIAGNOSIS — M25512 Pain in left shoulder: Secondary | ICD-10-CM | POA: Diagnosis not present

## 2023-08-26 DIAGNOSIS — M7661 Achilles tendinitis, right leg: Secondary | ICD-10-CM | POA: Diagnosis not present

## 2023-08-26 DIAGNOSIS — M205X1 Other deformities of toe(s) (acquired), right foot: Secondary | ICD-10-CM

## 2023-08-26 DIAGNOSIS — Z1331 Encounter for screening for depression: Secondary | ICD-10-CM | POA: Diagnosis not present

## 2023-08-26 DIAGNOSIS — M65271 Calcific tendinitis, right ankle and foot: Secondary | ICD-10-CM | POA: Diagnosis not present

## 2023-08-26 DIAGNOSIS — M179 Osteoarthritis of knee, unspecified: Secondary | ICD-10-CM | POA: Diagnosis not present

## 2023-08-26 DIAGNOSIS — Z860101 Personal history of adenomatous and serrated colon polyps: Secondary | ICD-10-CM | POA: Diagnosis not present

## 2023-08-26 DIAGNOSIS — Z0000 Encounter for general adult medical examination without abnormal findings: Secondary | ICD-10-CM | POA: Diagnosis not present

## 2023-08-26 DIAGNOSIS — R82998 Other abnormal findings in urine: Secondary | ICD-10-CM | POA: Diagnosis not present

## 2023-08-26 DIAGNOSIS — I10 Essential (primary) hypertension: Secondary | ICD-10-CM | POA: Diagnosis not present

## 2023-08-26 MED ORDER — DICLOFENAC SODIUM 75 MG PO TBEC: 75.0000 mg | DELAYED_RELEASE_TABLET | Freq: Two times a day (BID) | ORAL | 2 refills | Status: DC

## 2023-08-26 MED ORDER — TRIAMCINOLONE ACETONIDE 10 MG/ML IJ SUSP
10.0000 mg | Freq: Once | INTRAMUSCULAR | Status: AC
  Administered 2023-08-26: 10 mg via INTRA_ARTICULAR

## 2023-08-26 NOTE — Progress Notes (Signed)
 Subjective:   Patient ID: Rebekah Fernandez, female   DOB: 73 y.o.   MRN: [id number]   HPI Presents with severe pain back the right heel that she states has been present for a fairly long period of time.  States that she has tried ice therapy she is wearing good shoes with lifts in the heels and she has had to reduce all of her activities.  States its gotten worse over that period of time.  Patient does not smoke likes to be active   Review of Systems  All other systems reviewed and are negative.       Objective:  Physical Exam Vitals and nursing note reviewed.  Constitutional:      Appearance: She is well-developed.  Pulmonary:     Effort: Pulmonary effort is normal.  Musculoskeletal:        General: Normal range of motion.  Skin:    General: Skin is warm.  Neurological:     Mental Status: She is alert.     Neurovascular status intact muscle strength found to be adequate range of motion adequate exquisite discomfort posterior aspect right heel at the insertional point tendon calcaneus lateral side without central or medial involvement.  Good digital perfusion well oriented x 3 is wearing shoes with relative elevated heels     Assessment:  Achilles tendinitis right with inflammation of the lateral side insertion with the central and medial in good shape     Plan:  H&P x-rays reviewed condition discussed.  I did discuss injection risk of this and to carefully do just the outside patient wants this understanding risk and I carefully injected 3 mg dexamethasone Kenalog  5 mg Xylocaine and then due to the intensity of discomfort I did do air fracture walker to completely immobilize the posterior heel.  Patient will be seen back in around 4 weeks after returning from [place]  and all questions answered today  X-rays indicate small spur no indication stress fracture arthritis     Patient

## 2023-09-23 ENCOUNTER — Ambulatory Visit: Payer: HMO | Admitting: Podiatry

## 2023-09-23 ENCOUNTER — Encounter: Payer: Self-pay | Admitting: Podiatry

## 2023-09-23 VITALS — Ht 62.0 in | Wt 200.0 lb

## 2023-09-23 DIAGNOSIS — M65271 Calcific tendinitis, right ankle and foot: Secondary | ICD-10-CM | POA: Diagnosis not present

## 2023-09-23 NOTE — Progress Notes (Signed)
 Subjective:   Patient ID: Rebekah Fernandez, female   DOB: 73 y.o.   MRN: [id number]   HPI Patient presents stating that she seems to be improved still having moderate pain but better and patient has been wearing the boot   ROS      Objective:  Physical Exam  Neuro vascular status intact discomfort pain in the right posterior heel that has improved but still mildly tender with good range of motion no equinus condition noted      Assessment:  Achilles tendinitis right improving still present     Plan:  H&P reviewed and I went ahead today and I advised this patient on stretching exercises ice therapy boot usage continuing oral anti-inflammatory and not wearing shoes which put a lot of pressure against the back.  I did discuss possible PRP injection possible shockwave therapy depending on the response and we both agree that we want avoid surgery as best as possible.  At this point relatively optimistic

## 2023-10-30 ENCOUNTER — Ambulatory Visit: Admitting: Podiatry

## 2023-11-03 ENCOUNTER — Other Ambulatory Visit: Payer: Self-pay | Admitting: Podiatry

## 2023-11-11 DIAGNOSIS — H5213 Myopia, bilateral: Secondary | ICD-10-CM | POA: Diagnosis not present

## 2023-11-11 DIAGNOSIS — H52223 Regular astigmatism, bilateral: Secondary | ICD-10-CM | POA: Diagnosis not present

## 2023-11-11 DIAGNOSIS — H524 Presbyopia: Secondary | ICD-10-CM | POA: Diagnosis not present

## 2023-11-11 DIAGNOSIS — H2513 Age-related nuclear cataract, bilateral: Secondary | ICD-10-CM | POA: Diagnosis not present

## 2023-11-11 DIAGNOSIS — H53143 Visual discomfort, bilateral: Secondary | ICD-10-CM | POA: Diagnosis not present

## 2023-11-23 DIAGNOSIS — M7661 Achilles tendinitis, right leg: Secondary | ICD-10-CM | POA: Diagnosis not present

## 2023-11-23 DIAGNOSIS — M79672 Pain in left foot: Secondary | ICD-10-CM | POA: Diagnosis not present

## 2023-12-10 DIAGNOSIS — M79671 Pain in right foot: Secondary | ICD-10-CM | POA: Diagnosis not present

## 2023-12-10 DIAGNOSIS — M25571 Pain in right ankle and joints of right foot: Secondary | ICD-10-CM | POA: Diagnosis not present

## 2024-01-04 DIAGNOSIS — M7661 Achilles tendinitis, right leg: Secondary | ICD-10-CM | POA: Diagnosis not present

## 2024-01-15 ENCOUNTER — Other Ambulatory Visit: Payer: Self-pay | Admitting: Podiatry

## 2024-04-15 ENCOUNTER — Other Ambulatory Visit: Payer: Self-pay | Admitting: Podiatry
